# Patient Record
Sex: Male | Born: 1950 | Race: White | Hispanic: No
Health system: Southern US, Community
[De-identification: ages and names within clinical notes are randomized; demographics above are authoritative.]

## PROBLEM LIST (undated history)

## (undated) DIAGNOSIS — C801 Malignant (primary) neoplasm, unspecified: Secondary | ICD-10-CM

---

## 2019-07-25 ENCOUNTER — Emergency Department (HOSPITAL_COMMUNITY): Payer: Medicare HMO

## 2019-07-25 ENCOUNTER — Encounter (HOSPITAL_COMMUNITY): Payer: Self-pay

## 2019-07-25 ENCOUNTER — Inpatient Hospital Stay (HOSPITAL_COMMUNITY)
Admission: EM | Admit: 2019-07-25 | Discharge: 2019-07-29 | DRG: 871 | Disposition: E | Payer: Medicare HMO | Attending: Specialist | Admitting: Specialist

## 2019-07-25 DIAGNOSIS — R6521 Severe sepsis with septic shock: Secondary | ICD-10-CM | POA: Diagnosis present

## 2019-07-25 DIAGNOSIS — Z8507 Personal history of malignant neoplasm of pancreas: Secondary | ICD-10-CM

## 2019-07-25 DIAGNOSIS — K559 Vascular disorder of intestine, unspecified: Secondary | ICD-10-CM | POA: Diagnosis not present

## 2019-07-25 DIAGNOSIS — Z515 Encounter for palliative care: Secondary | ICD-10-CM | POA: Diagnosis present

## 2019-07-25 DIAGNOSIS — Z66 Do not resuscitate: Secondary | ICD-10-CM | POA: Diagnosis present

## 2019-07-25 DIAGNOSIS — A419 Sepsis, unspecified organism: Secondary | ICD-10-CM | POA: Diagnosis present

## 2019-07-25 DIAGNOSIS — N179 Acute kidney failure, unspecified: Secondary | ICD-10-CM | POA: Diagnosis present

## 2019-07-25 DIAGNOSIS — E11649 Type 2 diabetes mellitus with hypoglycemia without coma: Secondary | ICD-10-CM | POA: Diagnosis present

## 2019-07-25 DIAGNOSIS — I4891 Unspecified atrial fibrillation: Secondary | ICD-10-CM | POA: Diagnosis present

## 2019-07-25 DIAGNOSIS — K631 Perforation of intestine (nontraumatic): Secondary | ICD-10-CM | POA: Diagnosis present

## 2019-07-25 DIAGNOSIS — K7200 Acute and subacute hepatic failure without coma: Secondary | ICD-10-CM | POA: Diagnosis present

## 2019-07-25 DIAGNOSIS — R34 Anuria and oliguria: Secondary | ICD-10-CM | POA: Diagnosis present

## 2019-07-25 DIAGNOSIS — I469 Cardiac arrest, cause unspecified: Secondary | ICD-10-CM | POA: Diagnosis present

## 2019-07-25 DIAGNOSIS — Z20828 Contact with and (suspected) exposure to other viral communicable diseases: Secondary | ICD-10-CM | POA: Diagnosis present

## 2019-07-25 DIAGNOSIS — K6389 Other specified diseases of intestine: Secondary | ICD-10-CM

## 2019-07-25 DIAGNOSIS — J9602 Acute respiratory failure with hypercapnia: Secondary | ICD-10-CM | POA: Diagnosis present

## 2019-07-25 HISTORY — DX: Malignant (primary) neoplasm, unspecified: C80.1

## 2019-07-25 LAB — RESPIRATORY PANEL BY RT PCR (FLU A&B, COVID)
Influenza A by PCR: NEGATIVE
Influenza B by PCR: NEGATIVE
SARS Coronavirus 2 by RT PCR: NEGATIVE

## 2019-07-25 LAB — RAPID URINE DRUG SCREEN, HOSP PERFORMED
Amphetamines: NOT DETECTED
Barbiturates: NOT DETECTED
Benzodiazepines: NOT DETECTED
Cocaine: NOT DETECTED
Opiates: NOT DETECTED
Tetrahydrocannabinol: NOT DETECTED

## 2019-07-25 LAB — URINALYSIS, ROUTINE W REFLEX MICROSCOPIC
Bilirubin Urine: NEGATIVE
Glucose, UA: 50 mg/dL — AB
Ketones, ur: NEGATIVE mg/dL
Leukocytes,Ua: NEGATIVE
Nitrite: NEGATIVE
Protein, ur: 30 mg/dL — AB
Specific Gravity, Urine: 1.016 (ref 1.005–1.030)
pH: 5 (ref 5.0–8.0)

## 2019-07-25 LAB — POCT I-STAT EG7
Acid-base deficit: 25 mmol/L — ABNORMAL HIGH (ref 0.0–2.0)
Bicarbonate: 9 mmol/L — ABNORMAL LOW (ref 20.0–28.0)
Calcium, Ion: 1 mmol/L — ABNORMAL LOW (ref 1.15–1.40)
HCT: 43 % (ref 39.0–52.0)
Hemoglobin: 14.6 g/dL (ref 13.0–17.0)
O2 Saturation: 87 %
Potassium: 3.8 mmol/L (ref 3.5–5.1)
Sodium: 134 mmol/L — ABNORMAL LOW (ref 135–145)
TCO2: 10 mmol/L — ABNORMAL LOW (ref 22–32)
pCO2, Ven: 49.8 mmHg (ref 44.0–60.0)
pH, Ven: 6.865 — CL (ref 7.250–7.430)
pO2, Ven: 90 mmHg — ABNORMAL HIGH (ref 32.0–45.0)

## 2019-07-25 LAB — POCT I-STAT 7, (LYTES, BLD GAS, ICA,H+H)
Acid-base deficit: 20 mmol/L — ABNORMAL HIGH (ref 0.0–2.0)
Bicarbonate: 12.2 mmol/L — ABNORMAL LOW (ref 20.0–28.0)
Calcium, Ion: 1.01 mmol/L — ABNORMAL LOW (ref 1.15–1.40)
HCT: 28 % — ABNORMAL LOW (ref 39.0–52.0)
Hemoglobin: 9.5 g/dL — ABNORMAL LOW (ref 13.0–17.0)
O2 Saturation: 96 %
Patient temperature: 93.8
Potassium: 4.2 mmol/L (ref 3.5–5.1)
Sodium: 135 mmol/L (ref 135–145)
TCO2: 14 mmol/L — ABNORMAL LOW (ref 22–32)
pCO2 arterial: 51.3 mmHg — ABNORMAL HIGH (ref 32.0–48.0)
pH, Arterial: 6.965 — CL (ref 7.350–7.450)
pO2, Arterial: 115 mmHg — ABNORMAL HIGH (ref 83.0–108.0)

## 2019-07-25 LAB — CBC WITH DIFFERENTIAL/PLATELET
Abs Immature Granulocytes: 0.34 10*3/uL — ABNORMAL HIGH (ref 0.00–0.07)
Basophils Absolute: 0.1 10*3/uL (ref 0.0–0.1)
Basophils Relative: 1 %
Eosinophils Absolute: 0.1 10*3/uL (ref 0.0–0.5)
Eosinophils Relative: 0 %
HCT: 44.8 % (ref 39.0–52.0)
Hemoglobin: 13.7 g/dL (ref 13.0–17.0)
Immature Granulocytes: 2 %
Lymphocytes Relative: 14 %
Lymphs Abs: 2.2 10*3/uL (ref 0.7–4.0)
MCH: 30.2 pg (ref 26.0–34.0)
MCHC: 30.6 g/dL (ref 30.0–36.0)
MCV: 98.9 fL (ref 80.0–100.0)
Monocytes Absolute: 1.8 10*3/uL — ABNORMAL HIGH (ref 0.1–1.0)
Monocytes Relative: 11 %
Neutro Abs: 11.8 10*3/uL — ABNORMAL HIGH (ref 1.7–7.7)
Neutrophils Relative %: 72 %
Platelets: 207 10*3/uL (ref 150–400)
RBC: 4.53 MIL/uL (ref 4.22–5.81)
RDW: 14.3 % (ref 11.5–15.5)
WBC: 16.3 10*3/uL — ABNORMAL HIGH (ref 4.0–10.5)
nRBC: 0.2 % (ref 0.0–0.2)

## 2019-07-25 LAB — TYPE AND SCREEN
ABO/RH(D): B POS
Antibody Screen: NEGATIVE

## 2019-07-25 LAB — PROTIME-INR
INR: 1.5 — ABNORMAL HIGH (ref 0.8–1.2)
Prothrombin Time: 17.5 seconds — ABNORMAL HIGH (ref 11.4–15.2)

## 2019-07-25 LAB — POC SARS CORONAVIRUS 2 AG -  ED: SARS Coronavirus 2 Ag: NEGATIVE

## 2019-07-25 LAB — I-STAT CHEM 8, ED
BUN: 50 mg/dL — ABNORMAL HIGH (ref 8–23)
Calcium, Ion: 1 mmol/L — ABNORMAL LOW (ref 1.15–1.40)
Chloride: 102 mmol/L (ref 98–111)
Creatinine, Ser: 2.9 mg/dL — ABNORMAL HIGH (ref 0.61–1.24)
Glucose, Bld: 180 mg/dL — ABNORMAL HIGH (ref 70–99)
HCT: 43 % (ref 39.0–52.0)
Hemoglobin: 14.6 g/dL (ref 13.0–17.0)
Potassium: 4 mmol/L (ref 3.5–5.1)
Sodium: 133 mmol/L — ABNORMAL LOW (ref 135–145)
TCO2: 11 mmol/L — ABNORMAL LOW (ref 22–32)

## 2019-07-25 LAB — POC OCCULT BLOOD, ED: Fecal Occult Bld: POSITIVE — AB

## 2019-07-25 LAB — LACTIC ACID, PLASMA: Lactic Acid, Venous: >11 mmol/L (ref 0.5–1.9)

## 2019-07-25 LAB — LIPASE, BLOOD: Lipase: 13 U/L (ref 11–51)

## 2019-07-25 LAB — ABO/RH: ABO/RH(D): B POS

## 2019-07-25 LAB — TROPONIN I (HIGH SENSITIVITY): Troponin I (High Sensitivity): 80 ng/L — ABNORMAL HIGH (ref <18)

## 2019-07-25 MED ORDER — VANCOMYCIN HCL IN DEXTROSE 1-5 GM/200ML-% IV SOLN
1000.0000 mg | Freq: Once | INTRAVENOUS | Status: AC
  Administered 2019-07-25: 1000 mg via INTRAVENOUS
  Filled 2019-07-25: qty 200

## 2019-07-25 MED ORDER — SODIUM BICARBONATE 8.4 % IV SOLN
INTRAVENOUS | Status: AC | PRN
  Administered 2019-07-25: 50 meq via INTRAVENOUS

## 2019-07-25 MED ORDER — PANTOPRAZOLE SODIUM 40 MG IV SOLR
40.0000 mg | Freq: Two times a day (BID) | INTRAVENOUS | Status: DC
  Administered 2019-07-26: 40 mg via INTRAVENOUS
  Filled 2019-07-25: qty 40

## 2019-07-25 MED ORDER — SODIUM CHLORIDE 0.9 % IV SOLN
INTRAVENOUS | Status: AC | PRN
  Administered 2019-07-25 (×4): 1000 mL via INTRAVENOUS

## 2019-07-25 MED ORDER — STERILE WATER FOR INJECTION IV SOLN
INTRAVENOUS | Status: DC
  Filled 2019-07-25 (×2): qty 850

## 2019-07-25 MED ORDER — SODIUM CHLORIDE 0.9 % IV SOLN: Freq: Once | INTRAVENOUS | Status: AC

## 2019-07-25 MED ORDER — SODIUM BICARBONATE 8.4 % IV SOLN
INTRAVENOUS | Status: AC
  Filled 2019-07-25: qty 50

## 2019-07-25 MED ORDER — SODIUM CHLORIDE 0.9 % IV SOLN
2.0000 g | Freq: Once | INTRAVENOUS | Status: AC
  Administered 2019-07-25: 2 g via INTRAVENOUS
  Filled 2019-07-25: qty 2

## 2019-07-25 MED ORDER — NOREPINEPHRINE BITARTRATE 1 MG/ML IV SOLN
INTRAVENOUS | Status: AC | PRN
  Administered 2019-07-25: 5 mg via INTRAVENOUS
  Administered 2019-07-25: 20 mg via INTRAVENOUS
  Administered 2019-07-25: 2 mg via INTRAVENOUS
  Administered 2019-07-25: 10 mg via INTRAVENOUS
  Administered 2019-07-25: 15 mg via INTRAVENOUS

## 2019-07-25 MED ORDER — HEPARIN SODIUM (PORCINE) 5000 UNIT/ML IJ SOLN
5000.0000 [IU] | Freq: Three times a day (TID) | INTRAMUSCULAR | Status: DC
  Administered 2019-07-26: 5000 [IU] via SUBCUTANEOUS
  Filled 2019-07-25: qty 1

## 2019-07-25 MED ORDER — NOREPINEPHRINE BITARTRATE 1 MG/ML IV SOLN
INTRAVENOUS | Status: AC | PRN
  Administered 2019-07-25: 15 mg via INTRAVENOUS

## 2019-07-25 MED ORDER — NOREPINEPHRINE 4 MG/250ML-% IV SOLN
INTRAVENOUS | Status: AC
  Filled 2019-07-25: qty 250

## 2019-07-25 MED ORDER — METRONIDAZOLE IN NACL 5-0.79 MG/ML-% IV SOLN
500.0000 mg | Freq: Once | INTRAVENOUS | Status: AC
  Administered 2019-07-25: 500 mg via INTRAVENOUS
  Filled 2019-07-25: qty 100

## 2019-07-25 NOTE — ED Triage Notes (Signed)
Pt comes via [facility], hx of pancreatic cancer, has been c/o of n/v all day, LSN at 1830, found in the bathroom unresponsive by wife, in PEA upon firs arrival, CPR started, received 7 minutes total and 2 epi, now in afib.

## 2019-07-25 NOTE — ED Provider Notes (Addendum)
[facility] EMERGENCY DEPARTMENT Provider Note   CSN: [id number] Arrival date & time: 07/01/2019  2007     History Chief Complaint  Patient presents with  . Cardiac Arrest    Cody Mcdowell is a 68 y.o. male.  Level 5 caveat due to critical condition.  Patient with [facility] airway.  Per family patient with not feeling well over the last several days.  Has had some nausea vomiting and abdominal pain.  Lost pulses with EMS upon arrival.  Had PEA.  CPR for 7 minutes.  Return of spontaneous circulation after 2 rounds of epinephrine.  Atrial fibrillation and hypotension upon arrival.  The history is provided by the patient.  Cardiac Arrest Witnessed by:  Family member Incident location:  Home Time since incident:  2 hours Time before ALS initiated:  Immediate Condition upon EMS arrival:  Agonal respirations Pulse:  Weak (eventually absent) Initial cardiac rhythm per EMS:  PEA Treatments prior to arrival:  ACLS protocol Medications given prior to ED:  Epinephrine Airway: king. Rhythm on admission to ED:  Atrial fibrillation      Past Medical History:  Diagnosis Date  . Cancer [facility])     Patient Active Problem List   Diagnosis Date Noted  . Bowel perforation ([place])   . Ischemic bowel disease ([place])   . Cardiac arrest ([place]) 07/24/2019      No family history on file.  Social History   Tobacco Use  . Smoking status: Not on file  Substance Use Topics  . Alcohol use: Not on file  . Drug use: Not on file    Home Medications Prior to Admission medications   Not on File    Allergies    Patient has no allergy information on record.  Review of Systems   Review of Systems  Unable to perform ROS: Acuity of condition    Physical Exam Updated Vital Signs  ED Triage Vitals  Enc Vitals Group     BP 07/01/2019 2015 (!) 74/47     Pulse Rate 07/04/2019 2011 82     Resp 06/28/2019 2015 (!) 26     Temp 06/30/2019 2034 98.2 F (36.8 C)     Temp Source  07/10/2019 2034 Rectal     SpO2 07/27/2019 2015 100 %     Weight --      Height 07/08/2019 2027 5\' 11"  (1.803 m)     Head Circumference --      Peak Flow --      Pain Score --      Pain Loc --      Pain Edu? --      Excl. in [place]? --     Physical Exam Constitutional:      General: He is in acute distress.     Appearance: He is ill-appearing.  HENT:     Head: Normocephalic and atraumatic.     Nose: Nose normal.     Mouth/Throat:     Mouth: Mucous membranes are dry.  Eyes:     Conjunctiva/sclera: Conjunctivae normal.     Comments: 2 mm and sluggish b/l  Cardiovascular:     Rate and Rhythm: Tachycardia present. Rhythm irregular.     Pulses: Normal pulses.     Heart sounds: Normal heart sounds.  Pulmonary:     Breath sounds: Rhonchi present.     Comments: King airway in place with vomitus Abdominal:     General: There is distension.     Comments:  Firm abdomen  Musculoskeletal:        General: No deformity.     Cervical back: Neck supple.     Right lower leg: No edema.     Left lower leg: No edema.  Skin:    General: Skin is dry.     Capillary Refill: Capillary refill takes more than 3 seconds.     Coloration: Skin is pale.     Comments: Extremities cool and mottled   Neurological:     Mental Status: He is unresponsive.     GCS: GCS eye subscore is 1. GCS verbal subscore is 1. GCS motor subscore is 1.     ED Results / Procedures / Treatments   Labs (all labs ordered are listed, but only abnormal results are displayed) Labs Reviewed  LACTIC ACID, PLASMA - Abnormal; Notable for the following components:      Result Value   Lactic Acid, Venous >11.0 (*)    All other components within normal limits  COMPREHENSIVE METABOLIC PANEL - Abnormal; Notable for the following components:   Chloride 95 (*)    CO2 10 (*)    Glucose, Bld 192 (*)    BUN 45 (*)    Creatinine, Ser 3.13 (*)    Total Protein 6.3 (*)    Albumin 2.9 (*)    AST 181 (*)    GFR calc non Af Amer 19 (*)     GFR calc Af Amer 22 (*)    Anion gap 32 (*)    All other components within normal limits  CBC WITH DIFFERENTIAL/PLATELET - Abnormal; Notable for the following components:   WBC 16.3 (*)    Neutro Abs 11.8 (*)    Monocytes Absolute 1.8 (*)    Abs Immature Granulocytes 0.34 (*)    All other components within normal limits  URINALYSIS, ROUTINE W REFLEX MICROSCOPIC - Abnormal; Notable for the following components:   APPearance HAZY (*)    Glucose, UA 50 (*)    Hgb urine dipstick MODERATE (*)    Protein, ur 30 (*)    Bacteria, UA RARE (*)    All other components within normal limits  PROTIME-INR - Abnormal; Notable for the following components:   Prothrombin Time 17.5 (*)    INR 1.5 (*)    All other components within normal limits  LACTIC ACID, PLASMA - Abnormal; Notable for the following components:   Lactic Acid, Venous >11.0 (*)    All other components within normal limits  I-STAT CHEM 8, ED - Abnormal; Notable for the following components:   Sodium 133 (*)    BUN 50 (*)    Creatinine, Ser 2.90 (*)    Glucose, Bld 180 (*)    Calcium, Ion 1.00 (*)    TCO2 11 (*)    All other components within normal limits  POC OCCULT BLOOD, ED - Abnormal; Notable for the following components:   Fecal Occult Bld POSITIVE (*)    All other components within normal limits  POCT I-STAT EG7 - Abnormal; Notable for the following components:   pH, Ven 6.865 (*)    pO2, Ven 90.0 (*)    Bicarbonate 9.0 (*)    TCO2 10 (*)    Acid-base deficit 25.0 (*)    Sodium 134 (*)    Calcium, Ion 1.00 (*)    All other components within normal limits  POCT I-STAT 7, (LYTES, BLD GAS, ICA,H+H) - Abnormal; Notable for the following components:   pH, Arterial 6.965 (*)  pCO2 arterial 51.3 (*)    pO2, Arterial 115.0 (*)    Bicarbonate 12.2 (*)    TCO2 14 (*)    Acid-base deficit 20.0 (*)    Calcium, Ion 1.01 (*)    HCT 28.0 (*)    Hemoglobin 9.5 (*)    All other components within normal limits  POCT I-STAT  7, (LYTES, BLD GAS, ICA,H+H) - Abnormal; Notable for the following components:   pH, Arterial 7.134 (*)    pCO2 arterial 27.1 (*)    Bicarbonate 9.6 (*)    TCO2 11 (*)    Acid-base deficit 19.0 (*)    Calcium, Ion 0.92 (*)    HCT 32.0 (*)    Hemoglobin 10.9 (*)    All other components within normal limits  TROPONIN I (HIGH SENSITIVITY) - Abnormal; Notable for the following components:   Troponin I (High Sensitivity) 80 (*)    All other components within normal limits  RESPIRATORY PANEL BY RT PCR (FLU A&B, COVID)  CULTURE, BLOOD (ROUTINE X 2)  CULTURE, BLOOD (ROUTINE X 2)  URINE CULTURE  LIPASE, BLOOD  RAPID URINE DRUG SCREEN, HOSP PERFORMED  ETHANOL  LACTIC ACID, PLASMA  BLOOD GAS, ARTERIAL  LACTIC ACID, PLASMA  CBC  CREATININE, SERUM  CBC  BASIC METABOLIC PANEL  MAGNESIUM  PHOSPHORUS  BLOOD GAS, ARTERIAL  HEMOGLOBIN A1C  POC SARS CORONAVIRUS 2 AG -  ED  TYPE AND SCREEN  ABO/RH  TROPONIN I (HIGH SENSITIVITY)    EKG  Patient EKG shows atrial fibrillation.  Has some ST depressions laterally but no ischemic changes or ST elevation  Radiology CT ABDOMEN PELVIS WO CONTRAST  Result Date: 07/19/2019 CLINICAL DATA:  Shortness of breath. Found unresponsive in the bathroom. Post CPR. History of pancreatic cancer. Abdominal distension; Shortness of breath EXAM: CT CHEST, ABDOMEN AND PELVIS WITHOUT CONTRAST TECHNIQUE: Multidetector CT imaging of the chest, abdomen and pelvis was performed following the standard protocol without IV contrast. COMPARISON:  Chest radiograph earlier this day. No remote exams available. FINDINGS: CT CHEST FINDINGS Cardiovascular: Aortic atherosclerosis. No aortic aneurysm. Heart is normal in size. No pericardial effusion. Mediastinum/Nodes: 1 endotracheal tube tip above the carina. Enteric tube in place, despite anterior to the esophagus is dilated and fluid-filled. Limited assessment for adenopathy given lack of IV contrast, no bulky mediastinal or hilar  adenopathy. Slightly heterogeneous thyroid gland without dominant nodule. Lungs/Pleura: Consolidation in the dependent right lower lobe. Minimal dependent atelectasis in the left lower lobe. Minimal debris in the mainstem bronchi. Mild central bronchial thickening. Small ground-glass opacity in the anterior right middle lobe may be pulmonary contusion. No pleural fluid. No definite pulmonary nodule or mass, breathing motion artifact limits assessment. Musculoskeletal: Advanced degenerative change of the shoulders. Motion artifact through the sternum limits assessment for fracture. Motion artifact through the anterior ribs limits assessment for fracture. No evidence of focal bone lesion. CT ABDOMEN PELVIS FINDINGS Hepatobiliary: Branching air in the left greater than right lobe of the liver, favor mesenteric gas or pneumobilia. Post cholecystectomy. Limited assessment for focal liver lesion given motion and lack contrast. Pancreas: No definite pancreatic tissue visualized, multiple surgical clips suggesting prior Whipple procedure. Spleen: No definite splenic tissue visualized, suspected splenectomy. Adrenals/Urinary Tract: No dominant adrenal nodule. No hydronephrosis. There is bilateral perinephric edema. Foley catheter decompresses the urinary bladder. Stomach/Bowel: Enteric tube tip in the stomach. Gastric anatomy is not well-defined. There is pneumatosis involving the cecum and ascending colon with mottled air in the adjacent mesentery and free air  in the anterior right abdomen. Small and large bowel diffusely fluid-filled and dilated. Mesenteric venous air is most prominent in the ileocolic mesentery, but also seen centrally. Vascular/Lymphatic: Aortic atherosclerosis. Air in the mesenteric veins of the ileocolic mesentery. Limited assessment for adenopathy. Reproductive: Prostate gland grossly normal, partially obscured by external artifact. Other: Mesenteric edema, air, and small amount of free fluid. Minimal  ascites/free fluid in the upper abdomen. Musculoskeletal: No focal bone lesion. IMPRESSION: 1. Cecal and ascending colonic pneumatosis with mottled air in the adjacent mesentery, mesenteric venous gas and multiple foci free air in the right abdomen. Findings consistent with bowel ischemia and probable bowel perforation given the free air. 2. Branching air in the left greater than right lobe of the liver, favor mesenteric gas over pneumobilia. 3. Small and large bowel diffusely fluid-filled and dilated, likely secondary to ileus. 4. Consolidation in the dependent right lower lobe, may be atelectasis, aspiration or pneumonia. Minimal debris in the mainstem bronchi. 5. Probable prior Whipple procedure, recommend correlation with surgical history. 6. Bilateral perinephric edema, nonspecific. Aortic Atherosclerosis (ICD10-I70.0). Critical Value/emergent results were called by telephone at the time of interpretation on 06/29/2019 at 11:06 pm to [place] , who verbally acknowledged these results. Electronically Signed   By: Keith Rake M.D.   On: 07/03/2019 23:08   CT Head Wo Contrast  Result Date: 07/04/2019 CLINICAL DATA:  Initial evaluation for acute headache.  Trauma. EXAM: CT HEAD WITHOUT CONTRAST TECHNIQUE: Contiguous axial images were obtained from the base of the skull through the vertex without intravenous contrast. COMPARISON:  None. FINDINGS: Brain: Mild age-related cerebral atrophy with chronic small vessel ischemic disease. Few scattered subcentimeter hypodensities involving the deep white matter of the right cerebral hemisphere could reflect small remote lacunar infarcts versus dilated perivascular spaces. No evidence for acute intracranial hemorrhage. No findings to suggest acute large vessel territory infarct. No mass lesion, midline shift, or mass effect. Ventricles are normal in size without evidence for hydrocephalus. No extra-axial fluid collection identified. Vascular: No  hyperdense vessel identified. Skull: Scalp soft tissues demonstrate no acute abnormality. Calvarium intact. Sinuses/Orbits: Globes and orbital soft tissues within normal limits. Scattered mucosal thickening noted within the ethmoidal air cells. Paranasal sinuses are otherwise clear. No mastoid effusion. IMPRESSION: 1. No acute intracranial abnormality. 2. Mild age-related cerebral atrophy with chronic small vessel ischemic disease. Electronically Signed   By: Jeannine Boga M.D.   On: 07/02/2019 23:03   CT Chest Wo Contrast  Result Date: 07/09/2019 CLINICAL DATA:  Shortness of breath. Found unresponsive in the bathroom. Post CPR. History of pancreatic cancer. Abdominal distension; Shortness of breath EXAM: CT CHEST, ABDOMEN AND PELVIS WITHOUT CONTRAST TECHNIQUE: Multidetector CT imaging of the chest, abdomen and pelvis was performed following the standard protocol without IV contrast. COMPARISON:  Chest radiograph earlier this day. No remote exams available. FINDINGS: CT CHEST FINDINGS Cardiovascular: Aortic atherosclerosis. No aortic aneurysm. Heart is normal in size. No pericardial effusion. Mediastinum/Nodes: 1 endotracheal tube tip above the carina. Enteric tube in place, despite anterior to the esophagus is dilated and fluid-filled. Limited assessment for adenopathy given lack of IV contrast, no bulky mediastinal or hilar adenopathy. Slightly heterogeneous thyroid gland without dominant nodule. Lungs/Pleura: Consolidation in the dependent right lower lobe. Minimal dependent atelectasis in the left lower lobe. Minimal debris in the mainstem bronchi. Mild central bronchial thickening. Small ground-glass opacity in the anterior right middle lobe may be pulmonary contusion. No pleural fluid. No definite pulmonary nodule or mass, breathing motion artifact limits assessment.  Musculoskeletal: Advanced degenerative change of the shoulders. Motion artifact through the sternum limits assessment for fracture.  Motion artifact through the anterior ribs limits assessment for fracture. No evidence of focal bone lesion. CT ABDOMEN PELVIS FINDINGS Hepatobiliary: Branching air in the left greater than right lobe of the liver, favor mesenteric gas or pneumobilia. Post cholecystectomy. Limited assessment for focal liver lesion given motion and lack contrast. Pancreas: No definite pancreatic tissue visualized, multiple surgical clips suggesting prior Whipple procedure. Spleen: No definite splenic tissue visualized, suspected splenectomy. Adrenals/Urinary Tract: No dominant adrenal nodule. No hydronephrosis. There is bilateral perinephric edema. Foley catheter decompresses the urinary bladder. Stomach/Bowel: Enteric tube tip in the stomach. Gastric anatomy is not well-defined. There is pneumatosis involving the cecum and ascending colon with mottled air in the adjacent mesentery and free air in the anterior right abdomen. Small and large bowel diffusely fluid-filled and dilated. Mesenteric venous air is most prominent in the ileocolic mesentery, but also seen centrally. Vascular/Lymphatic: Aortic atherosclerosis. Air in the mesenteric veins of the ileocolic mesentery. Limited assessment for adenopathy. Reproductive: Prostate gland grossly normal, partially obscured by external artifact. Other: Mesenteric edema, air, and small amount of free fluid. Minimal ascites/free fluid in the upper abdomen. Musculoskeletal: No focal bone lesion. IMPRESSION: 1. Cecal and ascending colonic pneumatosis with mottled air in the adjacent mesentery, mesenteric venous gas and multiple foci free air in the right abdomen. Findings consistent with bowel ischemia and probable bowel perforation given the free air. 2. Branching air in the left greater than right lobe of the liver, favor mesenteric gas over pneumobilia. 3. Small and large bowel diffusely fluid-filled and dilated, likely secondary to ileus. 4. Consolidation in the dependent right lower lobe,  may be atelectasis, aspiration or pneumonia. Minimal debris in the mainstem bronchi. 5. Probable prior Whipple procedure, recommend correlation with surgical history. 6. Bilateral perinephric edema, nonspecific. Aortic Atherosclerosis (ICD10-I70.0). Critical Value/emergent results were called by telephone at the time of interpretation on 06/30/2019 at 11:06 pm to [place] , who verbally acknowledged these results. Electronically Signed   By: Keith Rake M.D.   On: 07/22/2019 23:08   DG Chest Portable 1 View  Result Date: 07/17/2019 CLINICAL DATA:  Hypoxia EXAM: PORTABLE CHEST 1 VIEW COMPARISON:  None. FINDINGS: Endotracheal tube tip is 7.1 cm above the carina. Nasogastric tube tip and side port are in the stomach. No pneumothorax. Lungs are clear. Heart size and pulmonary vascularity are normal. No adenopathy. There is extensive arthropathy in each shoulder. There are multiple surgical clips in the upper abdomen. IMPRESSION: Tube positions as described without pneumothorax. No edema or consolidation. Cardiac silhouette normal. Electronically Signed   By: Lowella Grip III M.D.   On: 07/10/2019 20:33    Procedures .Critical Care Performed by: Lennice Sites, DO Authorized by: Lennice Sites, DO   Critical care provider statement:    Critical care time (minutes):  85   Critical care was necessary to treat or prevent imminent or life-threatening deterioration of the following conditions:  Sepsis and shock   Critical care was time spent personally by me on the following activities:  Blood draw for specimens, development of treatment plan with patient or surrogate, discussions with primary provider, evaluation of patient's response to treatment, examination of patient, ordering and performing treatments and interventions, ordering and review of laboratory studies, ordering and review of radiographic studies, pulse oximetry, review of old charts, re-evaluation of patient's condition  and obtaining history from patient or surrogate  Procedure Name: Intubation Date/Time: 08-22-2019  1:16 AM Performed by: Lennice Sites, DO Pre-anesthesia Checklist: Patient identified Oxygen Delivery Method: Ambu bag Preoxygenation: Pre-oxygenation with 100% oxygen Ventilation: Mask ventilation without difficulty Laryngoscope Size: Glidescope and 3 Grade View: Grade I Tube size: 7.5 mm Number of attempts: 1 Airway Equipment and Method: Video-laryngoscopy and Rigid stylet Placement Confirmation: ETT inserted through vocal cords under direct vision,  Positive ETCO2 and Breath sounds checked- equal and bilateral Secured at: 24 cm Tube secured with: ETT holder Dental Injury: Teeth and Oropharynx as per pre-operative assessment  Difficulty Due To: Difficulty was unanticipated Comments: After xray, advanced tube 2 more cm      (including critical care time)  Medications Ordered in ED Medications  sodium bicarbonate 150 mEq in sterile water 1,000 mL infusion ( Intravenous New Bag/Given 07/18/2019 2244)  norepinephrine (LEVOPHED) 4-5 MG/250ML-% infusion SOLN (has no administration in time range)  heparin injection 5,000 Units (has no administration in time range)  pantoprazole (PROTONIX) injection 40 mg (has no administration in time range)  insulin aspart (novoLOG) injection 0-9 Units (has no administration in time range)  piperacillin-tazobactam (ZOSYN) IVPB 3.375 g (has no administration in time range)  piperacillin-tazobactam (ZOSYN) IVPB 3.375 g (has no administration in time range)  norepinephrine (LEVOPHED) injection (20 mg Intravenous Given 07/11/2019 2201)  0.9 %  sodium chloride infusion (1,000 mLs Intravenous New Bag/Given 07/02/2019 2200)  ceFEPIme (MAXIPIME) 2 g in sodium chloride 0.9 % 100 mL IVPB (0 g Intravenous Stopped 07/07/2019 2228)  vancomycin (VANCOCIN) IVPB 1000 mg/200 mL premix (0 mg Intravenous Stopped 06/30/2019 2228)  sodium bicarbonate injection ( Intravenous Canceled  Entry 07/11/2019 2100)  metroNIDAZOLE (FLAGYL) IVPB 500 mg (0 mg Intravenous Stopped 07/12/2019 2338)  0.9 %  sodium chloride infusion ( Intravenous New Bag/Given 07/22/2019 2235)  norepinephrine (LEVOPHED) injection (15 mg Intravenous Given 07/13/2019 2339)    ED Course  I have reviewed the triage vital signs and the nursing notes.  Pertinent labs & imaging results that were available during my care of the patient were reviewed by me and considered in my medical decision making (see chart for details).    MDM Rules/Calculators/A&P     Cody Mcdowell is a 68 year old male with history of pancreatic cancer but unknown if other medical problems.  Patient comes in after CPR.  Patient had faint pulses and agonal breathing with fire department and had started CPR by the time EMS arrived.  After 2 rounds of CPR total of about 7 minutes patient had return of spontaneous circulation.  He had PEA on rhythm strip throughout CPR.  He was given 2 rounds of epinephrine.  King airway was placed.  EMS had about a 40-minute transfer time from the scene.  He was started on epinephrine for hypotension.  He arrives with good pulses.  He has brownish/blood-tinged stool on the stretcher and there is some vomitus from the [facility] airway.  Patient arrived hypotensive and was started on Levophed and 4 L of normal saline was ordered.  Patient had successful intubation without any paralytics or sedation.  Patient was obtunded.  Had good pulses centrally but faint distal pulses.  Abdomen appeared slightly rigid.  EKG showed atrial fibrillation between 90 and 120.  No ischemic changes.  I was able to obtain some history from the patient's aunt states that the patient had not been feeling well since Saturday.  Has had 3 days of nausea and vomiting and generalized malaise.  Was refusing to go to the hospital despite not feeling well and having  poor appetite.  She was unable to provide much history about his chronic medical problems.  Did  state that he did not have any cardiac issues.  Was not complaining of any chest pain.  I tried to perform bedside echocardiogram but could not obtain any good windows.  Broad work-up was initiated.  Patient found to have a pH of 6.8 however his CO2 was overall unremarkable.  Blood sugar was unremarkable as well.  Suspect a metabolic acidosis.  Was given amps of bicarb.  Empirically started on IV antibiotics including vancomycin, cefepime, Flagyl.  Possible intra-abdominal process.  Will get CT scan of his head, chest, abdomen, pelvis for further evaluation.  Patient does have a lactic acidosis greater than 11.  White count of 16.  Hemoglobin however is normal at 13.  Patient on 15 mcg of Levophed.  He is intubated.  Chest x-ray thus far shows no infectious process.  Awaiting CMP and CT imaging.  Intensive care team has been consulted.  Patient is hypothermic and started warming blankets. Patient critically ill and suspect intra-abdominal process.  Radiology called me on the phone to talk about CT scan of the abdomen and pelvis that shows likely ischemic bowel process.  There is likely perforation.  There is cecal and ascending colonic pneumatosis with mottled air within the adjacent mesentery.  Overall findings are consistent with bowel ischemia likely bowel perforation.  CT scan of the chest and head are unremarkable.  Patient has already been started on antibiotics.  Dr. Reece Agar with general surgery has been made aware of CT scan findings and will come evaluate the patient and discuss any surgical options with family.  Talk to critical care team and updated them about imaging.  They are down to the ED evaluated the patient as well.  Had discussion with them about possibly using heparin but will defer at this time to their management.  He remains critically ill but hemodynamically stable on 20 mcg of Levophed.  Blood pressure has stabilized.  Heart rate in the 90s.  He continues to be hypothermic.  Repeat  lactic acid is ordered as well as continuing of IV bicarb infusion.  Appears to be having good response IV fluids as has made a couple 100 cc of urine on reevaluation.  Extremities appear to be still cool.  Will continue further IV fluid hydration.  This chart was dictated using voice recognition software.  Despite best efforts to proofread,  errors can occur which can change the documentation meaning.     Final Clinical Impression(s) / ED Diagnoses Final diagnoses:  Sepsis, due to unspecified organism, unspecified whether acute organ dysfunction present [facility])  Bowel perforation ([place])  Ischemic bowel disease ([place])  Pneumatosis of intestines  Septic shock [facility])    Rx / DC Orders ED Discharge Orders    None       Lennice Sites, DO 07/27/2019 2347    Lennice Sites, DO 08/11/19 0119

## 2019-07-25 NOTE — H&P (Addendum)
NAME:  Cody Mcdowell, MRN:  [id number], DOB:  07-Feb-1951, LOS: 0 ADMISSION DATE:  07/15/2019, CONSULTATION DATE: 12/28 REFERRING MD: Dr. Ronnald Nian EDP, CHIEF COMPLAINT: Cardiac arrest  Brief History   68 year old male found down in bathroom unresponsive.  PEA on EMS arrival.  ROSC after 7 minutes of ACLS.  Unresponsive in ED  History of present illness   68 year old male past medical history as below, which is significant for pancreatic cancer in the 90s.  Other medical history unknown at this time.  According to an aunt he has been complaining of abdominal pain nausea/vomiting for the past 3 days.  He was last seen well around 1830 on 12/20 and was found approximately 30 minutes later by his wife down on the floor the bathroom.  EMS was called and was there very shortly initiating CPR.  ACLS was undertaken for approximately 7 minutes prior to ROSC.  Upon arrival to the emergency department he was intubated.  Laboratory evaluation significant for creatinine 3.13, bicarb 10, AST 181, lactic acid greater than 11, pH 6.97, WBC 16.3.  CT scan of the abdomen concerning for bowel ischemia and probable bowel perforation.  PCCM asked to admit.   Past Medical History   has a past medical history of Cancer ([place]).   Significant Hospital Events   12/28 admit  Consults:    Procedures:  ETT 12/28 >  Significant Diagnostic Tests:  CT head 12/28 > No acute intracranial abnormality CT chest, abd, pelv 12/28 > findings consistent with bowel ischemia and probable bowel perforation. Air in the left > right lobe of the liver. Prior whipple.   Micro Data:  Blood 12/28 >  Antimicrobials:  Zosyn 12/28 >  Interim history/subjective:    Objective   Blood pressure 107/83, pulse (!) 107, temperature (!) 92.2 F (33.4 C), resp. rate (!) 21, height 5\' 11"  (1.803 m), SpO2 100 %.    Vent Mode: PRVC FiO2 (%):  [100 %] 100 % Set Rate:  [20 bmp-24 bmp] 24 bmp Vt Set:  [600 mL] 600 mL PEEP:  [5 cmH20]  5 cmH20 Plateau Pressure:  [17 cmH20] 17 cmH20   Intake/Output Summary (Last 24 hours) at 07/20/2019 2326 Last data filed at 07/21/2019 2228 Gross per 24 hour  Intake 300 ml  Output --  Net 300 ml   There were no vitals filed for this visit.  Examination: General: chronically ill appearing male in NAD HENT: [place]/AT, PERRL, no JVD Lungs: Clear bilateral Cardiovascular: IRIR tachy, no MRG Abdomen: Firm distended. Absent bowel sounds.  Extremities: No acute deformity or ROM limitation Neuro: Unresponsive. No pain response. Infrequent myoclonus. + corneal. + gag.  GU: Foley  Resolved Hospital Problem list     Assessment & Plan:   Cardiac arrest - Admit to ICU  - Telemetry - Will defer TTM due to possible surgical need, profound acidosis, shock - EEG - Trend BMP, Lactic, troponin  Bowel ischemia Bowel perforation - Zosyn - Surgery consulted. I suspect he will not be a surgical candidate, which would render his current situation likely non-survivable.  - NGT to LIS - NPO  Septic vs Cardiogenic shock - Volume resuscitation - Levophed for MAP > 65 mmHg - may need CVL, current dose of levophed should be able to be weaned.   Acute hypercarbic respiratory failure - Full vent support - Follow ABG - VAP bundle  DM - SSI  Atrial fibrillation with RVR: likely due to the severity of his illness.  - Defer treatment  at this time - Monitor - If persists pending improvement would consider amiodarone infusion.   Best practice:  Diet: NPO Pain/Anxiety/Delirium protocol (if indicated): NA VAP protocol (if indicated): Per protocol DVT prophylaxis: SQH GI prophylaxis: PPI BID Glucose control: NA Mobility: BR Code Status: DNR Family Communication: Aunt [name] (helped raise him) [phone] (cell) [phone] (home). PCP is Dr Arelia Sneddon in [place]. Aunt feels that if surgery is an option we should be aggressive. Otherwise continue conservative management and  possibly transition to comfort care.  Disposition: ICU  Labs   CBC: Recent Labs  Lab 07/27/2019 2021 07/17/2019 2022 07/01/2019 2046 07/16/2019 2115  WBC  --   --  16.3*  --   NEUTROABS  --   --  11.8*  --   HGB 14.6 14.6 13.7 9.5*  HCT 43.0 43.0 44.8 28.0*  MCV  --   --  98.9  --   PLT  --   --  207  --     Basic Metabolic Panel: Recent Labs  Lab 07/09/2019 2021 07/09/2019 2022 07/17/2019 2046 07/07/2019 2115  NA 134* 133* 137 135  K 3.8 4.0 4.0 4.2  CL  --  102 95*  --   CO2  --   --  10*  --   GLUCOSE  --  180* 192*  --   BUN  --  50* 45*  --   CREATININE  --  2.90* 3.13*  --   CALCIUM  --   --  9.2  --    GFR: CrCl cannot be calculated (Unknown ideal weight.). Recent Labs  Lab 07/19/2019 2045 07/24/2019 2046  WBC  --  16.3*  LATICACIDVEN >11.0*  --     Liver Function Tests: Recent Labs  Lab 07/05/2019 2046  AST 181*  ALT <5  ALKPHOS 94  BILITOT 1.2  PROT 6.3*  ALBUMIN 2.9*   Recent Labs  Lab 07/19/2019 2046  LIPASE 13   No results for input(s): AMMONIA in the last 168 hours.  ABG    Component Value Date/Time   PHART 6.965 (LL) 07/22/2019 2115   PCO2ART 51.3 (H) 07/18/2019 2115   PO2ART 115.0 (H) 07/23/2019 2115   HCO3 12.2 (L) 07/10/2019 2115   TCO2 14 (L) 07/21/2019 2115   ACIDBASEDEF 20.0 (H) 06/29/2019 2115   O2SAT 96.0 07/23/2019 2115     Coagulation Profile: Recent Labs  Lab 07/25/19 2052  INR 1.5*    Cardiac Enzymes: No results for input(s): CKTOTAL, CKMB, CKMBINDEX, TROPONINI in the last 168 hours.  HbA1C: No results found for: HGBA1C  CBG: No results for input(s): GLUCAP in the last 168 hours.  Review of Systems:   Not able.   Past Medical History  He,  has a past medical history of Cancer ([place]).   Surgical History   The histories are not reviewed yet. Please review them in the "History" navigator section and refresh this [place].   Social History      Family History   His family history is not on file.   Allergies Not  on File   Home Medications  Prior to Admission medications   Not on File     Critical care time: 60 mins     Georgann Housekeeper, AGACNP-BC [place] for personal pager PCCM on call pager [phone]  25-Aug-2019 12:21 AM  Patient seen at bedside with NP Hoffman.  Critical care planning and discussed at length and I agree with the above  assessment and plan.  Will await surgical decision.  Prognosis is poor.

## 2019-07-26 ENCOUNTER — Inpatient Hospital Stay (HOSPITAL_COMMUNITY): Payer: Medicare HMO

## 2019-07-26 DIAGNOSIS — K631 Perforation of intestine (nontraumatic): Secondary | ICD-10-CM | POA: Insufficient documentation

## 2019-07-26 DIAGNOSIS — K559 Vascular disorder of intestine, unspecified: Secondary | ICD-10-CM | POA: Insufficient documentation

## 2019-07-26 LAB — URINE CULTURE: Culture: NO GROWTH

## 2019-07-26 LAB — COMPREHENSIVE METABOLIC PANEL
ALT: 152 U/L — ABNORMAL HIGH (ref 0–44)
ALT: 2040 U/L — ABNORMAL HIGH (ref 0–44)
AST: 181 U/L — ABNORMAL HIGH (ref 15–41)
AST: 3497 U/L — ABNORMAL HIGH (ref 15–41)
Albumin: 2.9 g/dL — ABNORMAL LOW (ref 3.5–5.0)
Albumin: 2.6 g/dL — ABNORMAL LOW (ref 3.5–5.0)
Alkaline Phosphatase: 94 U/L (ref 38–126)
Alkaline Phosphatase: 101 U/L (ref 38–126)
Anion gap: 32 — ABNORMAL HIGH (ref 5–15)
Anion gap: 25 — ABNORMAL HIGH (ref 5–15)
BUN: 45 mg/dL — ABNORMAL HIGH (ref 8–23)
BUN: 42 mg/dL — ABNORMAL HIGH (ref 8–23)
CO2: 10 mmol/L — ABNORMAL LOW (ref 22–32)
CO2: 12 mmol/L — ABNORMAL LOW (ref 22–32)
Calcium: 9.2 mg/dL (ref 8.9–10.3)
Calcium: 6.7 mg/dL — ABNORMAL LOW (ref 8.9–10.3)
Chloride: 95 mmol/L — ABNORMAL LOW (ref 98–111)
Chloride: 103 mmol/L (ref 98–111)
Creatinine, Ser: 3.13 mg/dL — ABNORMAL HIGH (ref 0.61–1.24)
Creatinine, Ser: 3.21 mg/dL — ABNORMAL HIGH (ref 0.61–1.24)
GFR calc Af Amer: 22 mL/min — ABNORMAL LOW (ref >60)
GFR calc Af Amer: 22 mL/min — ABNORMAL LOW (ref >60)
GFR calc non Af Amer: 19 mL/min — ABNORMAL LOW (ref >60)
GFR calc non Af Amer: 19 mL/min — ABNORMAL LOW (ref >60)
Glucose, Bld: 192 mg/dL — ABNORMAL HIGH (ref 70–99)
Glucose, Bld: 70 mg/dL (ref 70–99)
Potassium: 4 mmol/L (ref 3.5–5.1)
Potassium: 4.2 mmol/L (ref 3.5–5.1)
Sodium: 137 mmol/L (ref 135–145)
Sodium: 140 mmol/L (ref 135–145)
Total Bilirubin: 1.2 mg/dL (ref 0.3–1.2)
Total Bilirubin: 2 mg/dL — ABNORMAL HIGH (ref 0.3–1.2)
Total Protein: 6.3 g/dL — ABNORMAL LOW (ref 6.5–8.1)
Total Protein: 4.8 g/dL — ABNORMAL LOW (ref 6.5–8.1)

## 2019-07-26 LAB — POCT I-STAT 7, (LYTES, BLD GAS, ICA,H+H)
Acid-base deficit: 19 mmol/L — ABNORMAL HIGH (ref 0.0–2.0)
Acid-base deficit: 21 mmol/L — ABNORMAL HIGH (ref 0.0–2.0)
Bicarbonate: 8.5 mmol/L — ABNORMAL LOW (ref 20.0–28.0)
Bicarbonate: 9.6 mmol/L — ABNORMAL LOW (ref 20.0–28.0)
Calcium, Ion: 0.9 mmol/L — ABNORMAL LOW (ref 1.15–1.40)
Calcium, Ion: 0.92 mmol/L — ABNORMAL LOW (ref 1.15–1.40)
HCT: 30 % — ABNORMAL LOW (ref 39.0–52.0)
HCT: 32 % — ABNORMAL LOW (ref 39.0–52.0)
Hemoglobin: 10.2 g/dL — ABNORMAL LOW (ref 13.0–17.0)
Hemoglobin: 10.9 g/dL — ABNORMAL LOW (ref 13.0–17.0)
O2 Saturation: 96 %
O2 Saturation: 97 %
Patient temperature: 32.5
Patient temperature: 92.1
Potassium: 3.8 mmol/L (ref 3.5–5.1)
Potassium: 4.1 mmol/L (ref 3.5–5.1)
Sodium: 135 mmol/L (ref 135–145)
Sodium: 135 mmol/L (ref 135–145)
TCO2: 11 mmol/L — ABNORMAL LOW (ref 22–32)
TCO2: 9 mmol/L — ABNORMAL LOW (ref 22–32)
pCO2 arterial: 25.8 mmHg — ABNORMAL LOW (ref 32.0–48.0)
pCO2 arterial: 27.1 mmHg — ABNORMAL LOW (ref 32.0–48.0)
pH, Arterial: 7.097 — CL (ref 7.350–7.450)
pH, Arterial: 7.134 — CL (ref 7.350–7.450)
pO2, Arterial: 101 mmHg (ref 83.0–108.0)
pO2, Arterial: 92 mmHg (ref 83.0–108.0)

## 2019-07-26 LAB — CBC
HCT: 36.4 % — ABNORMAL LOW (ref 39.0–52.0)
HCT: 33.5 % — ABNORMAL LOW (ref 39.0–52.0)
Hemoglobin: 11.4 g/dL — ABNORMAL LOW (ref 13.0–17.0)
Hemoglobin: 10.7 g/dL — ABNORMAL LOW (ref 13.0–17.0)
MCH: 30.3 pg (ref 26.0–34.0)
MCH: 30.5 pg (ref 26.0–34.0)
MCHC: 31.3 g/dL (ref 30.0–36.0)
MCHC: 31.9 g/dL (ref 30.0–36.0)
MCV: 96.8 fL (ref 80.0–100.0)
MCV: 95.4 fL (ref 80.0–100.0)
Platelets: 185 10*3/uL (ref 150–400)
Platelets: 157 10*3/uL (ref 150–400)
RBC: 3.76 MIL/uL — ABNORMAL LOW (ref 4.22–5.81)
RBC: 3.51 MIL/uL — ABNORMAL LOW (ref 4.22–5.81)
RDW: 14.7 % (ref 11.5–15.5)
RDW: 15.2 % (ref 11.5–15.5)
WBC: 10.1 10*3/uL (ref 4.0–10.5)
WBC: 11 10*3/uL — ABNORMAL HIGH (ref 4.0–10.5)
nRBC: 0.4 % — ABNORMAL HIGH (ref 0.0–0.2)
nRBC: 0.7 % — ABNORMAL HIGH (ref 0.0–0.2)

## 2019-07-26 LAB — BASIC METABOLIC PANEL
Anion gap: 27 — ABNORMAL HIGH (ref 5–15)
BUN: 42 mg/dL — ABNORMAL HIGH (ref 8–23)
CO2: 8 mmol/L — ABNORMAL LOW (ref 22–32)
Calcium: 6.9 mg/dL — ABNORMAL LOW (ref 8.9–10.3)
Chloride: 102 mmol/L (ref 98–111)
Creatinine, Ser: 2.7 mg/dL — ABNORMAL HIGH (ref 0.61–1.24)
GFR calc Af Amer: 27 mL/min — ABNORMAL LOW (ref >60)
GFR calc non Af Amer: 23 mL/min — ABNORMAL LOW (ref >60)
Glucose, Bld: 89 mg/dL (ref 70–99)
Potassium: 4.1 mmol/L (ref 3.5–5.1)
Sodium: 137 mmol/L (ref 135–145)

## 2019-07-26 LAB — HEMOGLOBIN A1C
Hgb A1c MFr Bld: 6.6 % — ABNORMAL HIGH (ref 4.8–5.6)
Mean Plasma Glucose: 142.72 mg/dL

## 2019-07-26 LAB — GLUCOSE, CAPILLARY
Glucose-Capillary: 51 mg/dL — ABNORMAL LOW (ref 70–99)
Glucose-Capillary: 70 mg/dL (ref 70–99)
Glucose-Capillary: 147 mg/dL — ABNORMAL HIGH (ref 70–99)
Glucose-Capillary: 38 mg/dL — CL (ref 70–99)
Glucose-Capillary: 139 mg/dL — ABNORMAL HIGH (ref 70–99)

## 2019-07-26 LAB — TROPONIN I (HIGH SENSITIVITY): Troponin I (High Sensitivity): 337 ng/L (ref <18)

## 2019-07-26 LAB — LACTIC ACID, PLASMA
Lactic Acid, Venous: >11 mmol/L (ref 0.5–1.9)
Lactic Acid, Venous: >11 mmol/L (ref 0.5–1.9)

## 2019-07-26 LAB — MAGNESIUM
Magnesium: 5.6 mg/dL — ABNORMAL HIGH (ref 1.7–2.4)
Magnesium: 5.2 mg/dL — ABNORMAL HIGH (ref 1.7–2.4)

## 2019-07-26 LAB — PHOSPHORUS
Phosphorus: 8.6 mg/dL — ABNORMAL HIGH (ref 2.5–4.6)
Phosphorus: 7.4 mg/dL — ABNORMAL HIGH (ref 2.5–4.6)

## 2019-07-26 LAB — MRSA PCR SCREENING: MRSA by PCR: NEGATIVE

## 2019-07-26 LAB — ETHANOL: Alcohol, Ethyl (B): <10 mg/dL (ref <10)

## 2019-07-26 MED ORDER — PIPERACILLIN-TAZOBACTAM 3.375 G IVPB
3.3750 g | Freq: Three times a day (TID) | INTRAVENOUS | Status: DC
  Administered 2019-07-26: 3.375 g via INTRAVENOUS
  Filled 2019-07-26: qty 50

## 2019-07-26 MED ORDER — SODIUM BICARBONATE 8.4 % IV SOLN
100.0000 meq | Freq: Once | INTRAVENOUS | Status: AC
  Administered 2019-07-26: 100 meq via INTRAVENOUS
  Filled 2019-07-26: qty 100

## 2019-07-26 MED ORDER — NOREPINEPHRINE 4 MG/250ML-% IV SOLN
0.0000 ug/min | INTRAVENOUS | Status: DC
  Administered 2019-07-26: 30 ug/min via INTRAVENOUS
  Filled 2019-07-26 (×2): qty 250

## 2019-07-26 MED ORDER — ACETAMINOPHEN 650 MG RE SUPP: 650.0000 mg | Freq: Four times a day (QID) | RECTAL | Status: DC | PRN

## 2019-07-26 MED ORDER — GLYCOPYRROLATE 0.2 MG/ML IJ SOLN: 0.2000 mg | INTRAMUSCULAR | Status: DC | PRN

## 2019-07-26 MED ORDER — FENTANYL CITRATE (PF) 100 MCG/2ML IJ SOLN: 25.0000 ug | INTRAMUSCULAR | Status: DC | PRN

## 2019-07-26 MED ORDER — ALBUMIN HUMAN 5 % IV SOLN
25.0000 g | Freq: Once | INTRAVENOUS | Status: AC
  Filled 2019-07-26: qty 250

## 2019-07-26 MED ORDER — DIPHENHYDRAMINE HCL 50 MG/ML IJ SOLN: 25.0000 mg | INTRAMUSCULAR | Status: DC | PRN

## 2019-07-26 MED ORDER — PIPERACILLIN-TAZOBACTAM 3.375 G IVPB 30 MIN
3.3750 g | INTRAVENOUS | Status: AC
  Administered 2019-07-26: 3.375 g via INTRAVENOUS
  Filled 2019-07-26: qty 50

## 2019-07-26 MED ORDER — MORPHINE 100MG IN NS 100ML (1MG/ML) PREMIX INFUSION: 0.0000 mg/h | INTRAVENOUS | Status: DC

## 2019-07-26 MED ORDER — DEXTROSE 50 % IV SOLN
25.0000 g | INTRAVENOUS | Status: AC
  Administered 2019-07-26: 25 g via INTRAVENOUS

## 2019-07-26 MED ORDER — ACETAMINOPHEN 325 MG PO TABS: 650.0000 mg | ORAL_TABLET | Freq: Four times a day (QID) | ORAL | Status: DC | PRN

## 2019-07-26 MED ORDER — NOREPINEPHRINE 4 MG/250ML-% IV SOLN
INTRAVENOUS | Status: AC
  Administered 2019-07-26: 25 mg via INTRAVENOUS
  Filled 2019-07-26: qty 250

## 2019-07-26 MED ORDER — ALBUMIN HUMAN 5 % IV SOLN
INTRAVENOUS | Status: AC
  Administered 2019-07-26: 25 g via INTRAVENOUS
  Filled 2019-07-26: qty 250

## 2019-07-26 MED ORDER — SODIUM CHLORIDE 0.9 % IV BOLUS
1000.0000 mL | Freq: Once | INTRAVENOUS | Status: AC
  Administered 2019-07-26: 1000 mL via INTRAVENOUS

## 2019-07-26 MED ORDER — DEXTROSE 50 % IV SOLN
25.0000 g | INTRAVENOUS | Status: AC
  Administered 2019-07-26: 25 g via INTRAVENOUS
  Filled 2019-07-26: qty 100

## 2019-07-26 MED ORDER — GLYCOPYRROLATE 1 MG PO TABS
1.0000 mg | ORAL_TABLET | ORAL | Status: DC | PRN
  Filled 2019-07-26: qty 1

## 2019-07-26 MED ORDER — ORAL CARE MOUTH RINSE
15.0000 mL | OROMUCOSAL | Status: DC
  Administered 2019-07-26 (×2): 15 mL via OROMUCOSAL

## 2019-07-26 MED ORDER — MORPHINE BOLUS VIA INFUSION
5.0000 mg | INTRAVENOUS | Status: DC | PRN
  Filled 2019-07-26: qty 5

## 2019-07-26 MED ORDER — POLYVINYL ALCOHOL 1.4 % OP SOLN
1.0000 [drp] | Freq: Four times a day (QID) | OPHTHALMIC | Status: DC | PRN
  Filled 2019-07-26: qty 15

## 2019-07-26 MED ORDER — MORPHINE SULFATE (PF) 2 MG/ML IV SOLN: 2.0000 mg | INTRAVENOUS | Status: DC | PRN

## 2019-07-26 MED ORDER — CHLORHEXIDINE GLUCONATE 0.12% ORAL RINSE (MEDLINE KIT)
15.0000 mL | Freq: Two times a day (BID) | OROMUCOSAL | Status: DC
  Administered 2019-07-26: 15 mL via OROMUCOSAL

## 2019-07-26 MED ORDER — INSULIN ASPART 100 UNIT/ML ~~LOC~~ SOLN: 0.0000 [IU] | SUBCUTANEOUS | Status: DC

## 2019-07-26 MED ORDER — HALOPERIDOL LACTATE 5 MG/ML IJ SOLN: 2.5000 mg | INTRAMUSCULAR | Status: DC | PRN

## 2019-07-26 MED ORDER — VASOPRESSIN 20 UNIT/ML IV SOLN
0.0300 [IU]/min | INTRAVENOUS | Status: DC
  Administered 2019-07-26: 0.03 [IU]/min via INTRAVENOUS
  Filled 2019-07-26: qty 2

## 2019-07-26 MED ORDER — DEXTROSE 10 % IV SOLN: INTRAVENOUS | Status: DC

## 2019-07-29 NOTE — Progress Notes (Signed)
Patient transported from ED RESUS to [id number] with no complications.

## 2019-07-29 NOTE — Progress Notes (Signed)
   2019/08/22 0000  Clinical Encounter Type  Visited With Health care provider  Visit Type Initial;ED  Referral From Physician   Spoke w/ Dr. Via telephone and RN Jess in person. Chaplain available via page as desired.  Temple Pacini [place], [phone]

## 2019-07-29 NOTE — Progress Notes (Signed)
Pharmacy Antibiotic Note  Cody Mcdowell is a 69 y.o. male admitted on 07/01/2019 with CT findings consistent with bowel ischemia likely bowel perforation.  Pharmacy has been consulted for Zosyn dosing.  Plan: Zosyn 3.375g IV q8h (4 hour infusion).  Will f/u renal function, micro data, and pt's clinical condition  Height: 5\' 11"  (180.3 cm) IBW/kg (Calculated) : 75.3  Temp (24hrs), Avg:93 F (33.9 C), Min:91.8 F (33.2 C), Max:98.2 F (36.8 C)  Recent Labs  Lab 07/04/2019 2022 07/12/2019 2045 07/24/2019 2046  WBC  --   --  16.3*  CREATININE 2.90*  --  3.13*  LATICACIDVEN  --  >11.0*  --     CrCl cannot be calculated (Unknown ideal weight.).    Not on File  Antimicrobials this admission: 12/28 Vanc x1 12/28 Cefepime x 1 12/29 Zosyn >>   Microbiology results: 12/28 BCx:  12/28 UCx: 12/28 COVID-19/Flu A/B: negative  Thank you for allowing pharmacy to be a part of this patient's care.  Sherlon Handing, PharmD, BCPS Please see amion for complete clinical pharmacist phone list 19-Aug-2019 12:21 AM

## 2019-07-29 NOTE — Progress Notes (Signed)
PCCM INTERVAL PROGRESS NOTE   Eliezer Champagne updated on surgical recommendations. Given poor prognosis she has elected for no escalation of care. Should he deteriorate she would like to be contacted and would likely transition to comfort care, which would be in line with his wishes. She will likely come to see him in the morning.       Georgann Housekeeper, AGACNP-BC [place]  See Amion for personal pager PCCM on call pager [phone]  2019-08-12 1:19 AM

## 2019-07-29 NOTE — Progress Notes (Signed)
EEG complete - results pending 

## 2019-07-29 NOTE — Progress Notes (Signed)
Hypoglycemic Event  CBG: 38  Treatment: D50 50 mL (25 gm)  Given at 0824   Symptoms: None  Follow-up CBG: [id number] CBG Result:139  Possible Reasons for Event: Inadequate meal intake  Comments/MD notified: will discuss with MD during AM rounds.     Cody Mcdowell A

## 2019-07-29 NOTE — Consult Note (Signed)
Reason for Consult: pneumatosis Referring Physician: Fortino Sic is an 69 y.o. male.  HPI: 69 yo male with distant history of pancreatic cancer. He was complaining of abdominal pain for a few days and then was found down unresponsive. He underwent CPR for 7 minutes with ROSC.  Past Medical History:  Diagnosis Date  . Cancer [facility])     No family history on file.  Social History:  has no history on file for tobacco, alcohol, and drug.  Allergies: Not on File  Medications: I have reviewed the patient's current medications.  Results for orders placed or performed during the hospital encounter of 07/18/2019 (from the past 48 hour(s))  Urinalysis, Routine w reflex microscopic     Status: Abnormal   Collection Time: 07/28/2019  8:14 PM  Result Value Ref Range   Color, Urine YELLOW YELLOW   APPearance HAZY (A) CLEAR   Specific Gravity, Urine 1.016 1.005 - 1.030   pH 5.0 5.0 - 8.0   Glucose, UA 50 (A) NEGATIVE mg/dL   Hgb urine dipstick MODERATE (A) NEGATIVE   Bilirubin Urine NEGATIVE NEGATIVE   Ketones, ur NEGATIVE NEGATIVE mg/dL   Protein, ur 30 (A) NEGATIVE mg/dL   Nitrite NEGATIVE NEGATIVE   Leukocytes,Ua NEGATIVE NEGATIVE   RBC / HPF 0-5 0 - 5 RBC/hpf   WBC, UA 0-5 0 - 5 WBC/hpf   Bacteria, UA RARE (A) NONE SEEN   Squamous Epithelial / LPF 0-5 0 - 5   Mucus PRESENT    Hyaline Casts, UA PRESENT    Amorphous Crystal PRESENT     Comment: Performed at [facility], [address], [place], [place] [address]  POCT I-Stat EG7     Status: Abnormal   Collection Time: 07/24/2019  8:21 PM  Result Value Ref Range   pH, Ven 6.865 (LL) 7.250 - 7.430   pCO2, Ven 49.8 44.0 - 60.0 mmHg   pO2, Ven 90.0 (H) 32.0 - 45.0 mmHg   Bicarbonate 9.0 (L) 20.0 - 28.0 mmol/L   TCO2 10 (L) 22 - 32 mmol/L   O2 Saturation 87.0 %   Acid-base deficit 25.0 (H) 0.0 - 2.0 mmol/L   Sodium 134 (L) 135 - 145 mmol/L   Potassium 3.8 3.5 - 5.1 mmol/L   Calcium, Ion 1.00 (L) 1.15 - 1.40  mmol/L   HCT 43.0 39.0 - 52.0 %   Hemoglobin 14.6 13.0 - 17.0 g/dL   Patient temperature HIDE    Sample type VENOUS    Comment NOTIFIED PHYSICIAN   I-stat chem 8, ED (not at [facility] or [facility])     Status: Abnormal   Collection Time: 07/20/2019  8:22 PM  Result Value Ref Range   Sodium 133 (L) 135 - 145 mmol/L   Potassium 4.0 3.5 - 5.1 mmol/L   Chloride 102 98 - 111 mmol/L   BUN 50 (H) 8 - 23 mg/dL   Creatinine, Ser 2.90 (H) 0.61 - 1.24 mg/dL   Glucose, Bld 180 (H) 70 - 99 mg/dL   Calcium, Ion 1.00 (L) 1.15 - 1.40 mmol/L   TCO2 11 (L) 22 - 32 mmol/L   Hemoglobin 14.6 13.0 - 17.0 g/dL   HCT 43.0 39.0 - 52.0 %  POC occult blood, ED     Status: Abnormal   Collection Time: 07/16/2019  8:32 PM  Result Value Ref Range   Fecal Occult Bld POSITIVE (A) NEGATIVE  Respiratory Panel by RT PCR (Flu A&B, Covid) - Nasopharyngeal Swab  Status: None   Collection Time: 07/12/2019  8:35 PM   Specimen: Nasopharyngeal Swab  Result Value Ref Range   SARS Coronavirus 2 by RT PCR NEGATIVE NEGATIVE    Comment: (NOTE) SARS-CoV-2 target nucleic acids are NOT DETECTED. The SARS-CoV-2 RNA is generally detectable in upper respiratoy specimens during the acute phase of infection. The lowest concentration of SARS-CoV-2 viral copies this assay can detect is 131 copies/mL. A negative result does not preclude SARS-Cov-2 infection and should not be used as the sole basis for treatment or other patient management decisions. A negative result may occur with  improper specimen collection/handling, submission of specimen other than nasopharyngeal swab, presence of viral mutation(s) within the areas targeted by this assay, and inadequate number of viral copies (<131 copies/mL). A negative result must be combined with clinical observations, patient history, and epidemiological information. The expected result is Negative. Fact Sheet for Patients:  PinkCheek.be Fact Sheet for Healthcare  Providers:  GravelBags.it This test is not yet ap proved or cleared by the [place] FDA and  has been authorized for detection and/or diagnosis of SARS-CoV-2 by FDA under an Emergency Use Authorization (EUA). This EUA will remain  in effect (meaning this test can be used) for the duration of the COVID-19 declaration under Section 564(b)(1) of the Act, 21 U.S.C. section 360bbb-3(b)(1), unless the authorization is terminated or revoked sooner.    Influenza A by PCR NEGATIVE NEGATIVE   Influenza B by PCR NEGATIVE NEGATIVE    Comment: (NOTE) The Xpert Xpress SARS-CoV-2/FLU/RSV assay is intended as an aid in  the diagnosis of influenza from Nasopharyngeal swab specimens and  should not be used as a sole basis for treatment. Nasal washings and  aspirates are unacceptable for Xpert Xpress SARS-CoV-2/FLU/RSV  testing. Fact Sheet for Patients: PinkCheek.be Fact Sheet for Healthcare Providers: GravelBags.it This test is not yet approved or cleared by the [place] FDA and  has been authorized for detection and/or diagnosis of SARS-CoV-2 by  FDA under an Emergency Use Authorization (EUA). This EUA will remain  in effect (meaning this test can be used) for the duration of the  Covid-19 declaration under Section 564(b)(1) of the Act, 21  U.S.C. section 360bbb-3(b)(1), unless the authorization is  terminated or revoked. Performed at [facility], [place] [address] [place], [place] [address]   Lactic acid, plasma     Status: Abnormal   Collection Time: 07/15/2019  8:45 PM  Result Value Ref Range   Lactic Acid, Venous >11.0 (HH) 0.5 - 1.9 mmol/L    Comment: CRITICAL RESULT CALLED TO, READ BACK BY AND VERIFIED WITH: RN J FERRAINOLO @2203  07/22/2019 BY S GEZAHEGN Performed at [facility], [place] [address], [place], [place] [address]   Comprehensive metabolic panel     Status: Abnormal    Collection Time: 07/05/2019  8:46 PM  Result Value Ref Range   Sodium 137 135 - 145 mmol/L   Potassium 4.0 3.5 - 5.1 mmol/L   Chloride 95 (L) 98 - 111 mmol/L   CO2 10 (L) 22 - 32 mmol/L   Glucose, Bld 192 (H) 70 - 99 mg/dL   BUN 45 (H) 8 - 23 mg/dL   Creatinine, Ser 3.13 (H) 0.61 - 1.24 mg/dL   Calcium 9.2 8.9 - 10.3 mg/dL   Total Protein 6.3 (L) 6.5 - 8.1 g/dL   Albumin 2.9 (L) 3.5 - 5.0 g/dL   AST 181 (H) 15 - 41 U/L   ALT <5 0 -  44 U/L   Alkaline Phosphatase 94 38 - 126 U/L   Total Bilirubin 1.2 0.3 - 1.2 mg/dL   GFR calc non Af Amer 19 (L) >60 mL/min   GFR calc Af Amer 22 (L) >60 mL/min   Anion gap 32 (H) 5 - 15    Comment: Performed at [facility] [address], [place], [place] [address]  CBC with Differential     Status: Abnormal   Collection Time: 07/17/2019  8:46 PM  Result Value Ref Range   WBC 16.3 (H) 4.0 - 10.5 K/uL   RBC 4.53 4.22 - 5.81 MIL/uL   Hemoglobin 13.7 13.0 - 17.0 g/dL   HCT 44.8 39.0 - 52.0 %   MCV 98.9 80.0 - 100.0 fL   MCH 30.2 26.0 - 34.0 pg   MCHC 30.6 30.0 - 36.0 g/dL   RDW 14.3 11.5 - 15.5 %   Platelets 207 150 - 400 K/uL   nRBC 0.2 0.0 - 0.2 %   Neutrophils Relative % 72 %   Neutro Abs 11.8 (H) 1.7 - 7.7 K/uL   Lymphocytes Relative 14 %   Lymphs Abs 2.2 0.7 - 4.0 K/uL   Monocytes Relative 11 %   Monocytes Absolute 1.8 (H) 0.1 - 1.0 K/uL   Eosinophils Relative 0 %   Eosinophils Absolute 0.1 0.0 - 0.5 K/uL   Basophils Relative 1 %   Basophils Absolute 0.1 0.0 - 0.1 K/uL   Immature Granulocytes 2 %   Abs Immature Granulocytes 0.34 (H) 0.00 - 0.07 K/uL    Comment: Performed at [facility] [address], [place], [place] [address]  Troponin I (High Sensitivity)     Status: Abnormal   Collection Time: 07/11/2019  8:46 PM  Result Value Ref Range   Troponin I (High Sensitivity) 80 (H) <18 ng/L    Comment: (NOTE) Elevated high sensitivity troponin I (hsTnI) values and significant  changes across serial measurements may suggest ACS  but many other  chronic and acute conditions are known to elevate hsTnI results.  Refer to the "Links" section for chest pain algorithms and additional  guidance. Performed at [facility], [place] [address], [place], [place] [address]   Type and screen [place]     Status: None   Collection Time: 07/09/2019  8:46 PM  Result Value Ref Range   ABO/RH(D) B POS    Antibody Screen NEG    Sample Expiration      07/28/2019,2359 Performed at [facility], [place] [address], [place], [place] [address]   Lipase, blood     Status: None   Collection Time: 07/19/2019  8:46 PM  Result Value Ref Range   Lipase 13 11 - 51 U/L    Comment: Performed at [facility] [address] [place], [place] [address]  ABO/Rh     Status: None   Collection Time: 07/17/2019  8:46 PM  Result Value Ref Range   ABO/RH(D)      B POS Performed at [facility] [address] [place],  [address]   Protime-INR     Status: Abnormal   Collection Time: 07/09/2019  8:52 PM  Result Value Ref Range   Prothrombin Time 17.5 (H) 11.4 - 15.2 seconds   INR 1.5 (H) 0.8 - 1.2    Comment: (NOTE) INR goal varies based on device and disease states. Performed at [facility], [place] [address]  [place], [place] [address]   Rapid urine drug screen (hospital performed)     Status: None   Collection Time: 07/15/2019  8:52 PM  Result Value Ref Range   Opiates NONE DETECTED NONE DETECTED   Cocaine NONE DETECTED NONE DETECTED   Benzodiazepines NONE DETECTED NONE DETECTED   Amphetamines NONE DETECTED NONE DETECTED   Tetrahydrocannabinol NONE DETECTED NONE DETECTED   Barbiturates NONE DETECTED NONE DETECTED    Comment: (NOTE) DRUG SCREEN FOR MEDICAL PURPOSES ONLY.  IF CONFIRMATION IS NEEDED FOR ANY PURPOSE, NOTIFY LAB WITHIN 5 DAYS. LOWEST DETECTABLE LIMITS FOR URINE DRUG SCREEN Drug Class                     Cutoff (ng/mL) Amphetamine and metabolites    1000 Barbiturate and  metabolites    200 Benzodiazepine                 010 Tricyclics and metabolites     300 Opiates and metabolites        300 Cocaine and metabolites        300 THC                            50 Performed at [facility], [place] [address], [place], [place] [address]   POC SARS Coronavirus 2 Ag-ED - Nasal Swab (BD Veritor Kit)     Status: None   Collection Time: 07/14/2019  8:58 PM  Result Value Ref Range   SARS Coronavirus 2 Ag NEGATIVE NEGATIVE    Comment: (NOTE) SARS-CoV-2 antigen NOT DETECTED.  Negative results are presumptive.  Negative results do not preclude SARS-CoV-2 infection and should not be used as the sole basis for treatment or other patient management decisions, including infection  control decisions, particularly in the presence of clinical signs and  symptoms consistent with COVID-19, or in those who have been in contact with the virus.  Negative results must be combined with clinical observations, patient history, and epidemiological information. The expected result is Negative. Fact Sheet for Patients: PodPark.tn Fact Sheet for Healthcare Providers: GiftContent.is This test is not yet approved or cleared by the [place] FDA and  has been authorized for detection and/or diagnosis of SARS-CoV-2 by FDA under an Emergency Use Authorization (EUA).  This EUA will remain in effect (meaning this test can be used) for the duration of  the COVID-19 de claration under Section 564(b)(1) of the Act, 21 U.S.C. section 360bbb-3(b)(1), unless the authorization is terminated or revoked sooner.   I-STAT 7, (LYTES, BLD GAS, ICA, H+H)     Status: Abnormal   Collection Time: 06/29/2019  9:15 PM  Result Value Ref Range   pH, Arterial 6.965 (LL) 7.350 - 7.450   pCO2 arterial 51.3 (H) 32.0 - 48.0 mmHg   pO2, Arterial 115.0 (H) 83.0 - 108.0 mmHg   Bicarbonate 12.2 (L) 20.0 - 28.0 mmol/L   TCO2 14 (L) 22 - 32 mmol/L   O2  Saturation 96.0 %   Acid-base deficit 20.0 (H) 0.0 - 2.0 mmol/L   Sodium 135 135 - 145 mmol/L   Potassium 4.2 3.5 - 5.1 mmol/L   Calcium, Ion 1.01 (L) 1.15 - 1.40 mmol/L   HCT 28.0 (L) 39.0 - 52.0 %   Hemoglobin 9.5 (L) 13.0 - 17.0 g/dL   Patient temperature 93.8 F    Collection site RADIAL, ALLEN'S TEST ACCEPTABLE    Drawn by RT  Sample type ARTERIAL    Comment NOTIFIED PHYSICIAN   Ethanol     Status: None   Collection Time: 07/02/2019 11:31 PM  Result Value Ref Range   Alcohol, Ethyl (B) <10 <10 mg/dL    Comment: (NOTE) Lowest detectable limit for serum alcohol is 10 mg/dL. For medical purposes only. Performed at [facility], [place] [address] [place], [place] [address]   Lactic acid, plasma     Status: Abnormal   Collection Time: 06/30/2019 11:38 PM  Result Value Ref Range   Lactic Acid, Venous >11.0 (HH) 0.5 - 1.9 mmol/L    Comment: CRITICAL VALUE NOTED.  VALUE IS CONSISTENT WITH PREVIOUSLY REPORTED AND CALLED VALUE. Performed at [facility], [place] [address] [place], [place] [address]   I-STAT 7, (LYTES, BLD GAS, ICA, H+H)     Status: Abnormal   Collection Time: July 29, 2019 12:45 AM  Result Value Ref Range   pH, Arterial 7.134 (LL) 7.350 - 7.450   pCO2 arterial 27.1 (L) 32.0 - 48.0 mmHg   pO2, Arterial 92.0 83.0 - 108.0 mmHg   Bicarbonate 9.6 (L) 20.0 - 28.0 mmol/L   TCO2 11 (L) 22 - 32 mmol/L   O2 Saturation 96.0 %   Acid-base deficit 19.0 (H) 0.0 - 2.0 mmol/L   Sodium 135 135 - 145 mmol/L   Potassium 4.1 3.5 - 5.1 mmol/L   Calcium, Ion 0.92 (L) 1.15 - 1.40 mmol/L   HCT 32.0 (L) 39.0 - 52.0 %   Hemoglobin 10.9 (L) 13.0 - 17.0 g/dL   Patient temperature 92.1 F    Collection site RADIAL, ALLEN'S TEST ACCEPTABLE    Drawn by RT    Sample type ARTERIAL    Comment NOTIFIED PHYSICIAN     CT ABDOMEN PELVIS WO CONTRAST  Result Date: 06/30/2019 CLINICAL DATA:  Shortness of breath. Found unresponsive in the bathroom. Post CPR. History of pancreatic cancer.  Abdominal distension; Shortness of breath EXAM: CT CHEST, ABDOMEN AND PELVIS WITHOUT CONTRAST TECHNIQUE: Multidetector CT imaging of the chest, abdomen and pelvis was performed following the standard protocol without IV contrast. COMPARISON:  Chest radiograph earlier this day. No remote exams available. FINDINGS: CT CHEST FINDINGS Cardiovascular: Aortic atherosclerosis. No aortic aneurysm. Heart is normal in size. No pericardial effusion. Mediastinum/Nodes: 1 endotracheal tube tip above the carina. Enteric tube in place, despite anterior to the esophagus is dilated and fluid-filled. Limited assessment for adenopathy given lack of IV contrast, no bulky mediastinal or hilar adenopathy. Slightly heterogeneous thyroid gland without dominant nodule. Lungs/Pleura: Consolidation in the dependent right lower lobe. Minimal dependent atelectasis in the left lower lobe. Minimal debris in the mainstem bronchi. Mild central bronchial thickening. Small ground-glass opacity in the anterior right middle lobe may be pulmonary contusion. No pleural fluid. No definite pulmonary nodule or mass, breathing motion artifact limits assessment. Musculoskeletal: Advanced degenerative change of the shoulders. Motion artifact through the sternum limits assessment for fracture. Motion artifact through the anterior ribs limits assessment for fracture. No evidence of focal bone lesion. CT ABDOMEN PELVIS FINDINGS Hepatobiliary: Branching air in the left greater than right lobe of the liver, favor mesenteric gas or pneumobilia. Post cholecystectomy. Limited assessment for focal liver lesion given motion and lack contrast. Pancreas: No definite pancreatic tissue visualized, multiple surgical clips suggesting prior Whipple procedure. Spleen: No definite splenic tissue visualized, suspected splenectomy. Adrenals/Urinary Tract: No dominant adrenal nodule. No hydronephrosis. There is bilateral perinephric edema. Foley catheter decompresses the urinary  bladder. Stomach/Bowel: Enteric tube tip in  the stomach. Gastric anatomy is not well-defined. There is pneumatosis involving the cecum and ascending colon with mottled air in the adjacent mesentery and free air in the anterior right abdomen. Small and large bowel diffusely fluid-filled and dilated. Mesenteric venous air is most prominent in the ileocolic mesentery, but also seen centrally. Vascular/Lymphatic: Aortic atherosclerosis. Air in the mesenteric veins of the ileocolic mesentery. Limited assessment for adenopathy. Reproductive: Prostate gland grossly normal, partially obscured by external artifact. Other: Mesenteric edema, air, and small amount of free fluid. Minimal ascites/free fluid in the upper abdomen. Musculoskeletal: No focal bone lesion. IMPRESSION: 1. Cecal and ascending colonic pneumatosis with mottled air in the adjacent mesentery, mesenteric venous gas and multiple foci free air in the right abdomen. Findings consistent with bowel ischemia and probable bowel perforation given the free air. 2. Branching air in the left greater than right lobe of the liver, favor mesenteric gas over pneumobilia. 3. Small and large bowel diffusely fluid-filled and dilated, likely secondary to ileus. 4. Consolidation in the dependent right lower lobe, may be atelectasis, aspiration or pneumonia. Minimal debris in the mainstem bronchi. 5. Probable prior Whipple procedure, recommend correlation with surgical history. 6. Bilateral perinephric edema, nonspecific. Aortic Atherosclerosis (ICD10-I70.0). Critical Value/emergent results were called by telephone at the time of interpretation on 07/23/2019 at 11:06 pm to [place] , who verbally acknowledged these results. Electronically Signed   By: Keith Rake M.D.   On: 06/30/2019 23:08   CT Head Wo Contrast  Result Date: 07/24/2019 CLINICAL DATA:  Initial evaluation for acute headache.  Trauma. EXAM: CT HEAD WITHOUT CONTRAST TECHNIQUE: Contiguous axial  images were obtained from the base of the skull through the vertex without intravenous contrast. COMPARISON:  None. FINDINGS: Brain: Mild age-related cerebral atrophy with chronic small vessel ischemic disease. Few scattered subcentimeter hypodensities involving the deep white matter of the right cerebral hemisphere could reflect small remote lacunar infarcts versus dilated perivascular spaces. No evidence for acute intracranial hemorrhage. No findings to suggest acute large vessel territory infarct. No mass lesion, midline shift, or mass effect. Ventricles are normal in size without evidence for hydrocephalus. No extra-axial fluid collection identified. Vascular: No hyperdense vessel identified. Skull: Scalp soft tissues demonstrate no acute abnormality. Calvarium intact. Sinuses/Orbits: Globes and orbital soft tissues within normal limits. Scattered mucosal thickening noted within the ethmoidal air cells. Paranasal sinuses are otherwise clear. No mastoid effusion. IMPRESSION: 1. No acute intracranial abnormality. 2. Mild age-related cerebral atrophy with chronic small vessel ischemic disease. Electronically Signed   By: Jeannine Boga M.D.   On: 07/14/2019 23:03   CT Chest Wo Contrast  Result Date: 07/15/2019 CLINICAL DATA:  Shortness of breath. Found unresponsive in the bathroom. Post CPR. History of pancreatic cancer. Abdominal distension; Shortness of breath EXAM: CT CHEST, ABDOMEN AND PELVIS WITHOUT CONTRAST TECHNIQUE: Multidetector CT imaging of the chest, abdomen and pelvis was performed following the standard protocol without IV contrast. COMPARISON:  Chest radiograph earlier this day. No remote exams available. FINDINGS: CT CHEST FINDINGS Cardiovascular: Aortic atherosclerosis. No aortic aneurysm. Heart is normal in size. No pericardial effusion. Mediastinum/Nodes: 1 endotracheal tube tip above the carina. Enteric tube in place, despite anterior to the esophagus is dilated and fluid-filled.  Limited assessment for adenopathy given lack of IV contrast, no bulky mediastinal or hilar adenopathy. Slightly heterogeneous thyroid gland without dominant nodule. Lungs/Pleura: Consolidation in the dependent right lower lobe. Minimal dependent atelectasis in the left lower lobe. Minimal debris in the mainstem bronchi. Mild central bronchial thickening. Small  ground-glass opacity in the anterior right middle lobe may be pulmonary contusion. No pleural fluid. No definite pulmonary nodule or mass, breathing motion artifact limits assessment. Musculoskeletal: Advanced degenerative change of the shoulders. Motion artifact through the sternum limits assessment for fracture. Motion artifact through the anterior ribs limits assessment for fracture. No evidence of focal bone lesion. CT ABDOMEN PELVIS FINDINGS Hepatobiliary: Branching air in the left greater than right lobe of the liver, favor mesenteric gas or pneumobilia. Post cholecystectomy. Limited assessment for focal liver lesion given motion and lack contrast. Pancreas: No definite pancreatic tissue visualized, multiple surgical clips suggesting prior Whipple procedure. Spleen: No definite splenic tissue visualized, suspected splenectomy. Adrenals/Urinary Tract: No dominant adrenal nodule. No hydronephrosis. There is bilateral perinephric edema. Foley catheter decompresses the urinary bladder. Stomach/Bowel: Enteric tube tip in the stomach. Gastric anatomy is not well-defined. There is pneumatosis involving the cecum and ascending colon with mottled air in the adjacent mesentery and free air in the anterior right abdomen. Small and large bowel diffusely fluid-filled and dilated. Mesenteric venous air is most prominent in the ileocolic mesentery, but also seen centrally. Vascular/Lymphatic: Aortic atherosclerosis. Air in the mesenteric veins of the ileocolic mesentery. Limited assessment for adenopathy. Reproductive: Prostate gland grossly normal, partially obscured  by external artifact. Other: Mesenteric edema, air, and small amount of free fluid. Minimal ascites/free fluid in the upper abdomen. Musculoskeletal: No focal bone lesion. IMPRESSION: 1. Cecal and ascending colonic pneumatosis with mottled air in the adjacent mesentery, mesenteric venous gas and multiple foci free air in the right abdomen. Findings consistent with bowel ischemia and probable bowel perforation given the free air. 2. Branching air in the left greater than right lobe of the liver, favor mesenteric gas over pneumobilia. 3. Small and large bowel diffusely fluid-filled and dilated, likely secondary to ileus. 4. Consolidation in the dependent right lower lobe, may be atelectasis, aspiration or pneumonia. Minimal debris in the mainstem bronchi. 5. Probable prior Whipple procedure, recommend correlation with surgical history. 6. Bilateral perinephric edema, nonspecific. Aortic Atherosclerosis (ICD10-I70.0). Critical Value/emergent results were called by telephone at the time of interpretation on 07/09/2019 at 11:06 pm to [place] , who verbally acknowledged these results. Electronically Signed   By: Keith Rake M.D.   On: 07/24/2019 23:08   DG Chest Portable 1 View  Result Date: 07/15/2019 CLINICAL DATA:  Hypoxia EXAM: PORTABLE CHEST 1 VIEW COMPARISON:  None. FINDINGS: Endotracheal tube tip is 7.1 cm above the carina. Nasogastric tube tip and side port are in the stomach. No pneumothorax. Lungs are clear. Heart size and pulmonary vascularity are normal. No adenopathy. There is extensive arthropathy in each shoulder. There are multiple surgical clips in the upper abdomen. IMPRESSION: Tube positions as described without pneumothorax. No edema or consolidation. Cardiac silhouette normal. Electronically Signed   By: Lowella Grip III M.D.   On: 07/06/2019 20:33   Review of Systems  Unable to perform ROS: Acuity of condition   Blood pressure 90/65, pulse (!) 110, temperature (!)  92.1 F (33.4 C), resp. rate (!) 24, height 5' 11"  (1.803 m), SpO2 100 %. Physical Exam  Constitutional: He appears well-developed and well-nourished.  HENT:  Head: Normocephalic.  Cardiovascular:  tachycardic  Respiratory:  ventilated  GI:  Moderate distension  Neurological:  Minimal response to voice or touch  Skin: Skin is dry.   Assessment/Plan: 69 yo male with arrest. Findings concerning for bowel ischemia. Given arrest and persistent lactate > 11 and pH < 7, I do not think that  surgery would change the outcome of his care.   Arta Bruce Arsh Feutz 08/11/19, 12:50 AM

## 2019-07-29 NOTE — Procedures (Signed)
Patient Name: Cody Mcdowell  MRN: [id number]  Epilepsy Attending: Lora Havens  Referring Physician/Provider: Georgann Housekeeper, NP Date: 09-Aug-2019 Duration: 25.22 minutes  Patient history: 69 year old male with cardiac arrest, pancreatic cancer.  EEG to evaluate for seizures.  Level of alertness: Comatose  AEDs during EEG study: None  Technical aspects: This EEG study was done with scalp electrodes positioned according to the 10-20 International system of electrode placement. Electrical activity was acquired at a sampling rate of 500Hz  and reviewed with a high frequency filter of 70Hz  and a low frequency filter of 1Hz . EEG data were recorded continuously and digitally stored.   Description: EEG showed continuous generalized 2 to 3 Hz delta slowing.  Patient was noted to be chewing on the ET tube.  Concomitant EEG before during and after the event did not show any EEG change to suggest seizure.  EEG was reactive to tactile stimulation.  Hyperventilation and photic summation were not performed.  Abnormality -Continuous slow, generalized  IMPRESSION: This study is suggestive of profound diffuse encephalopathy, nonspecific etiology.  One episode of patient chewing on the ET tube was noted without any concomitant EEG change and was therefore not epileptic.  No seizures or epileptiform discharges were seen throughout the recording.  Aquinnah Devin Barbra Sarks

## 2019-07-29 NOTE — Progress Notes (Signed)
[facility] Progress Note Patient Name: Cody Mcdowell DOB: Sep 13, 1950 MRN: [id number]   Date of Service  08/04/2019  HPI/Events of Note  Pt with worsening respiratory acidosis  eICU Interventions  Sodium bicarbonate 2 amps iv push x 1        Davy [place] U Barbarita Hutmacher 08/04/19, 2:57 AM

## 2019-07-29 NOTE — Discharge Summary (Signed)
Physician Death Summary  Patient ID: Cody Mcdowell MRN: [id number] DOB/AGE: [birth year] 69 y.o.  Admit date: 07/22/2019 Discharge date: Aug 15, 2019  Admission Diagnoses: Cardiac arrest  Discharge Diagnoses:  Active Problems:   Cardiac arrest [facility]) Septic shock Perforated bowel Multiorgan failure  Discharged Condition: Deceased  Hospital Course:  69 year old male past medical history as below, which is significant for pancreatic cancer in the 90s.  Other medical history unknown at this time.  According to an aunt he has been complaining of abdominal pain nausea/vomiting for the past 3 days.  He was last seen well around 1830 on 12/20 and was found approximately 30 minutes later by his wife down on the floor the bathroom.  EMS was called and was there very shortly initiating CPR.  ACLS was undertaken for approximately 7 minutes prior to ROSC.  Upon arrival to the emergency department he was intubated.  Laboratory evaluation significant for creatinine 3.13, bicarb 10, AST 181, lactic acid greater than 11, pH 6.97, WBC 16.3.  CT scan of the abdomen concerning for bowel ischemia and probable bowel perforation.  PCCM asked to admit.  Evaluated by surgery who felt he is not a surgical candidate.  Overnight he has remained on 2 pressors, anuric, shock liver, acidotic, hypoglycemic with frank blood in OGT, hypothermic. Discussed with his aunt [name] over the telephone who reviewed his advanced directives.  She requested transition to comfort care and orders placed for terminal withdrawal of care  Signed: Marshell Garfinkel MD [facility] Aug 15, 2019, 10:34 AM

## 2019-07-29 NOTE — Progress Notes (Signed)
Patient time of death 36.  No heart and lung sounds auscultated for 2 minutes by Doretha Sou RN and Griffin Dakin RN.    No family at bedside.  Next of kin [name] (aunt) notified via telephone of time of death.  No personal belongings at bedside except for necklace on patient, next of kin notified the necklace will stay with the patient.  MD Mannam notified and advised will sign the death certificate.

## 2019-07-29 NOTE — Progress Notes (Signed)
NAME:  Cody Mcdowell, MRN:  [id number], DOB:  Nov 11, 1950, LOS: 1 ADMISSION DATE:  06/28/2019, CONSULTATION DATE: 12/28 REFERRING MD: Dr. Ronnald Nian EDP, CHIEF COMPLAINT: Cardiac arrest  Brief History   69 year old male found down in bathroom unresponsive.  PEA on EMS arrival.  ROSC after 7 minutes of ACLS.  Unresponsive in ED  History of present illness   69 year old male past medical history as below, which is significant for pancreatic cancer in the 90s.  Other medical history unknown at this time.  According to an aunt he has been complaining of abdominal pain nausea/vomiting for the past 3 days.  He was last seen well around 1830 on 12/20 and was found approximately 30 minutes later by his wife down on the floor the bathroom.  EMS was called and was there very shortly initiating CPR.  ACLS was undertaken for approximately 7 minutes prior to ROSC.  Upon arrival to the emergency department he was intubated.  Laboratory evaluation significant for creatinine 3.13, bicarb 10, AST 181, lactic acid greater than 11, pH 6.97, WBC 16.3.  CT scan of the abdomen concerning for bowel ischemia and probable bowel perforation.  PCCM asked to admit.   Past Medical History   has a past medical history of Cancer ([place]).   Significant Hospital Events   12/28 admit  Consults:    Procedures:  ETT 12/28 >  Significant Diagnostic Tests:  CT head 12/28 > No acute intracranial abnormality CT chest, abd, pelv 12/28 > findings consistent with bowel ischemia and probable bowel perforation. Air in the left > right lobe of the liver. Prior whipple.   Micro Data:  Blood 12/28 >  Antimicrobials:  Zosyn 12/28 >  Interim history/subjective:  Made DNR by NOK overnight. On 2 pressors, anuric, shock liver, acidotic, hypoglycemic. Frank blood in OGT. Hypothermic.  Objective   Blood pressure (!) 103/58, pulse (!) 102, temperature (!) 94.8 F (34.9 C), resp. rate (!) 0, height 5\' 11"  (1.803 m), weight 74 kg,  SpO2 100 %.    Vent Mode: PRVC FiO2 (%):  [50 %-100 %] 50 % Set Rate:  [20 bmp-24 bmp] 20 bmp Vt Set:  [600 mL] 600 mL PEEP:  [5 cmH20] 5 cmH20 Plateau Pressure:  [17 cmH20-22 cmH20] 22 cmH20   Intake/Output Summary (Last 24 hours) at 08-19-19 0930 Last data filed at 08/19/2019 0845 Gross per 24 hour  Intake 2304.6 ml  Output 295 ml  Net 2009.6 ml   Filed Weights   August 19, 2019 0500  Weight: 74 kg    Examination: General:critically and chronically ill appearing, adult male HENT: Normocephalic, PERRL-sluggish.Dry mucus membranes Neck: No JVD. Trachea midline.  CV: RRR. S1S2. No MRG. Cool EXT. Radial pulses only  Lungs: BBS coarse rhonchi throughout, tachypneic, symmetrical, vent supported  ABD: no BS x4. Rounded, firm GU: Foley with scant dark urine EXT: flaccid Skin: grey, mottled EXT Neuro:+ cough/gag, does not withdraw x4     Resolved Hospital Problem list     Assessment & Plan:   Cardiac arrest Bowel ischemia Bowel perforation-non surgical candidate   Refractory Combined Septic and Cardiogenic shock Acute hypercarbic respiratory failure Shock liver AKI-anuric  DM Atrial fibrillation with RVR Hypoglycemic   The patient is in MSOF d/t perforated bowel non-amenable to surgery s/p cardiac arrest with ROSC now in refractory shock. I update the pt's NOK, Cody Mcdowell, by phone, of these findings in laymans terms and reviewed discussions she had overnight with team. Cody Mcdowell states that in the interim  she was able to locate pt's advanced directives in his home and states that under these circumstances his wishes are for comfort measures and would not want to continue on life support and she feels this is best. Due to the pandemic and her advanced age she does not wish to come to hospital to be with patient. She has contacted her pastor and friends/family for support. I assured her we will be with "Cody Mcdowell" and let her know when he passes after transitioning to comfort  focus.   DNR/Comfort transition orders placed in EMR.   Best practice:D/c    Labs   CBC: Recent Labs  Lab 07/08/2019 2046 07/15/2019 2115 August 09, 2019 0045 08/09/19 0236 09-Aug-2019 0247 09-Aug-2019 0732  WBC 16.3*  --   --  10.1  --  11.0*  NEUTROABS 11.8*  --   --   --   --   --   HGB 13.7 9.5* 10.9* 11.4* 10.2* 10.7*  HCT 44.8 28.0* 32.0* 36.4* 30.0* 33.5*  MCV 98.9  --   --  96.8  --  95.4  PLT 207  --   --  185  --  [id number]    Basic Metabolic Panel: Recent Labs  Lab 07/09/2019 2022 07/17/2019 2046 07/12/2019 2115 09-Aug-2019 0045 08/09/2019 0236 08-09-2019 0247 08/09/2019 0732  NA 133* 137 135 135 137 135 140  K 4.0 4.0 4.2 4.1 4.1 3.8 4.2  CL 102 95*  --   --  102  --  103  CO2  --  10*  --   --  8*  --  12*  GLUCOSE 180* 192*  --   --  89  --  70  BUN 50* 45*  --   --  42*  --  42*  CREATININE 2.90* 3.13*  --   --  2.70*  --  3.21*  CALCIUM  --  9.2  --   --  6.9*  --  6.7*  MG  --   --   --   --  5.6*  --  5.2*  PHOS  --   --   --   --  8.6*  --  7.4*   GFR: Estimated Creatinine Clearance: 22.7 mL/min (A) (by C-G formula based on SCr of 3.21 mg/dL (H)). Recent Labs  Lab 07/16/2019 2045 07/14/2019 2046 06/28/2019 2338 08/09/2019 0236 August 09, 2019 0732  WBC  --  16.3*  --  10.1 11.0*  LATICACIDVEN >11.0*  --  >11.0* >11.0*  --     Liver Function Tests: Recent Labs  Lab 07/03/2019 2046 2019-08-09 0732  AST 181* PENDING  ALT 152* 2,040*  ALKPHOS 94 101  BILITOT 1.2 2.0*  PROT 6.3* 4.8*  ALBUMIN 2.9* 2.6*   Recent Labs  Lab 07/23/2019 2046  LIPASE 13   No results for input(s): AMMONIA in the last 168 hours.  ABG    Component Value Date/Time   PHART 7.097 (LL) 09-Aug-2019 0247   PCO2ART 25.8 (L) 2019-08-09 0247   PO2ART 101.0 08-09-19 0247   HCO3 8.5 (L) 08-09-19 0247   TCO2 9 (L) 08/09/2019 0247   ACIDBASEDEF 21.0 (H) 08/09/19 0247   O2SAT 97.0 08/09/2019 0247     Coagulation Profile: Recent Labs  Lab 07/06/2019 2052  INR 1.5*    Cardiac Enzymes: No results for  input(s): CKTOTAL, CKMB, CKMBINDEX, TROPONINI in the last 168 hours.  HbA1C: Hgb A1c MFr Bld  Date/Time Value Ref Range Status  08/09/2019 02:36 AM 6.6 (H) 4.8 - 5.6 %  Final    Comment:    (NOTE) Pre diabetes:          5.7%-6.4% Diabetes:              >6.4% Glycemic control for   <7.0% adults with diabetes     CBG: Recent Labs  Lab 07-31-2019 0237 07-31-2019 0323 07/31/19 0401 31-Jul-2019 0814 Jul 31, 2019 0844  GLUCAP 70 51* 147* 38* 139*      The patient is critically ill with MSOF. He requires ICU for high complexity decision making, titration of high alert medications, ventilator management, titration of oxygen and interpretation of advanced monitoring.    I personally spent 35 minutes providing critical care services including personally reviewing test results, discussing care with nursing staff/other physicians and completing orders pertaining to this patient.  Time was exclusive to the patient and does not include time spent teaching or in procedures.  Voice recognition software was used in the production of this record.  Errors in interpretation may have been inadvertently missed during review.  Francine Graven, MSN, AGACNP   Pulmonary & Critical Care

## 2019-07-29 NOTE — Progress Notes (Signed)
Patient transported from ED RESUS to CT and back with no complications.

## 2019-07-29 NOTE — Progress Notes (Signed)
Referral made to [facility]. Referral [phone]. This RN spoke with Ronney Asters with [facility]. A CDS coordinator will call back with a follow up.   [address] [place]

## 2019-07-29 NOTE — Progress Notes (Signed)
eLink Physician-Brief Progress Note Patient Name: Cody Mcdowell DOB: 1950/08/03 MRN: [id number]   Date of Service  August 22, 2019  HPI/Events of Note  Pt admitted through the ED following 2 days of abdominal pain, nausea and vomiting, PEA arrest at home with 7 minutes of ACLS protocol before ROSC, intubated in the ED, CTAP shows extensive bowel ischemia and perforation, additionally he has clinical findings consistent with a hemispheric stroke, general surgery has declined surgical intervention and his family made him a DNR with no escalation of Rx. Pt's prognosis is abysmal.  eICU Interventions  Nwe patient evaluation completed.        Kerry Kass Ogan 08/22/2019, 2:19 AM

## 2019-07-29 DEATH — deceased

## 2019-07-30 LAB — CULTURE, BLOOD (ROUTINE X 2)
Culture: NO GROWTH
Culture: NO GROWTH
Special Requests: ADEQUATE

## 2020-11-20 IMAGING — CT CT CHEST W/O CM
2 of 6 series · 11 of 36 positions shown, 13 images · non-contrast
Comparison: Chest radiograph earlier this day. No remote exams
available.

CLINICAL DATA: Shortness of breath. Found unresponsive in the
bathroom. Post CPR. History of pancreatic cancer.

Abdominal distension; Shortness of breath
EXAM:
CT CHEST, ABDOMEN AND PELVIS WITHOUT CONTRAST
TECHNIQUE: Multidetector CT imaging of the chest, abdomen and pelvis was
performed following the standard protocol without IV contrast.

[Series 7: cap w/o 2.0 mm st · axial · non-contrast · 0.87mm/px · z∈[-882,-350]mm · 8 of 342 slices shown, 10 images]
[im 38/342  mediastinal]
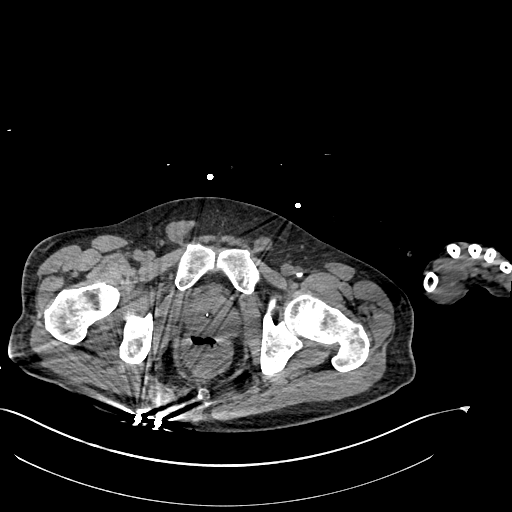
[im 38/342  lung]
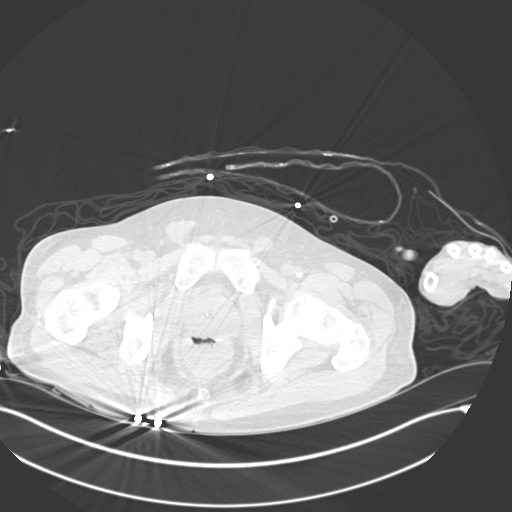
[im 76/342  lung]
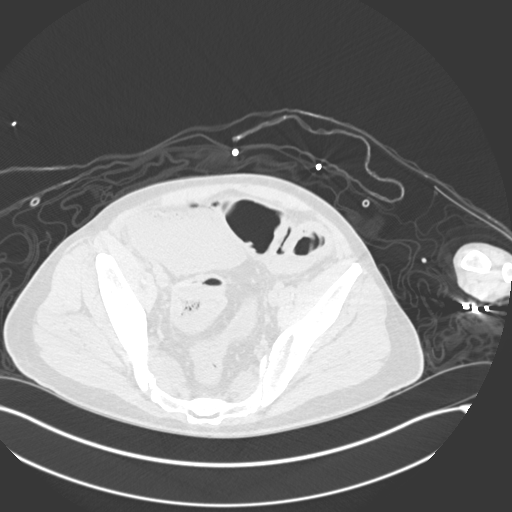
[im 114/342  lung]
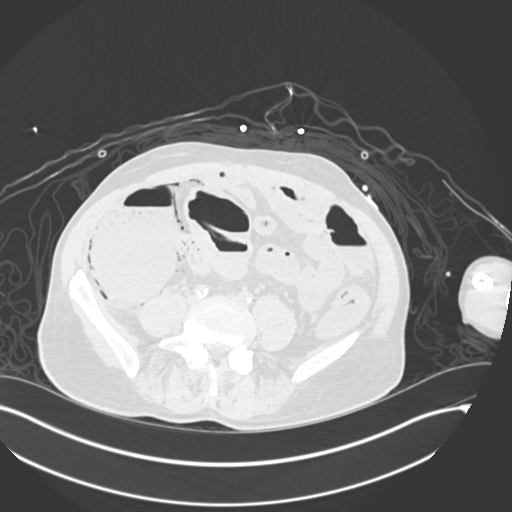
[im 152/342  lung]
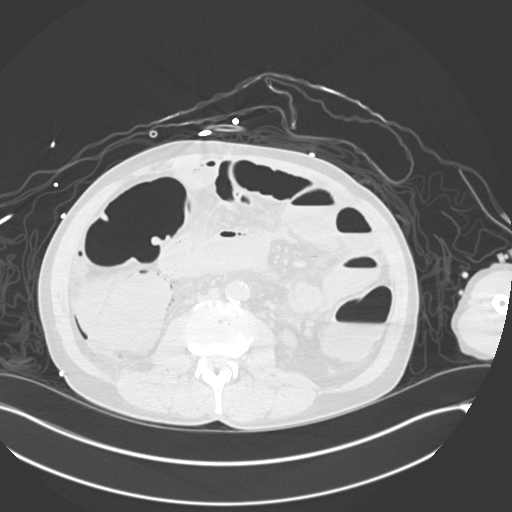
[im 190/342  mediastinal]
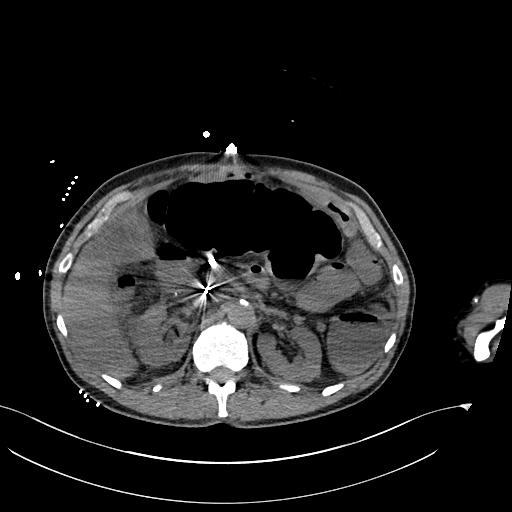
[im 190/342  lung]
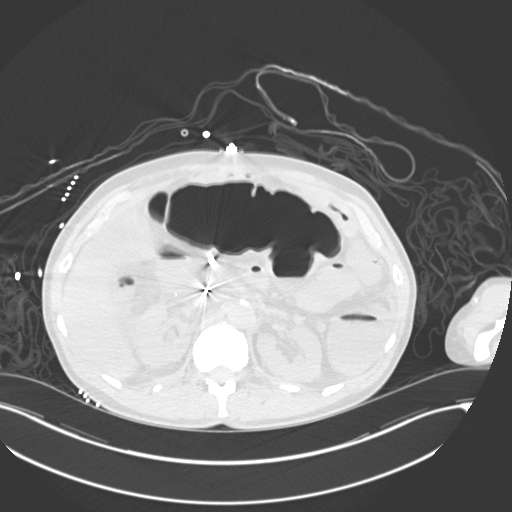
[im 228/342  lung]
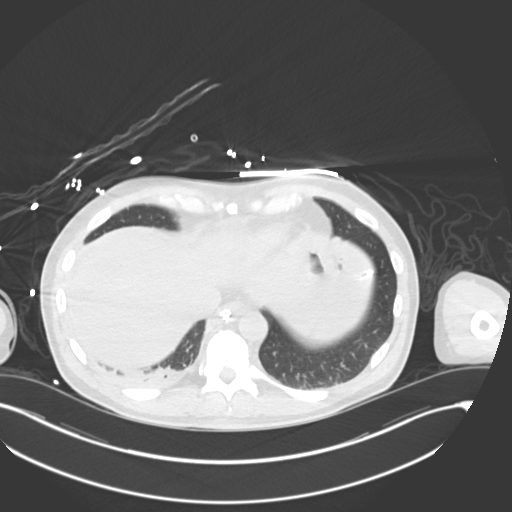
[im 266/342  lung]
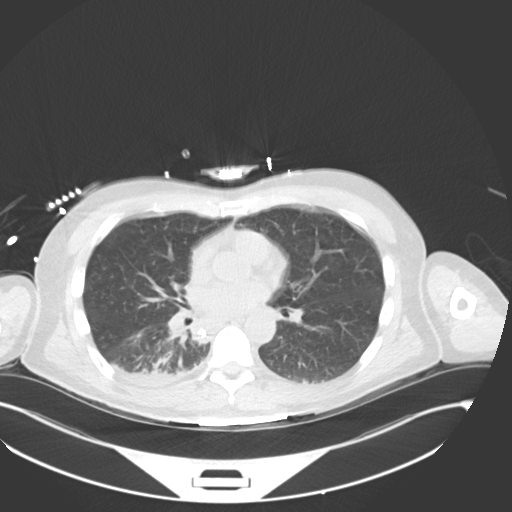
[im 304/342  lung]
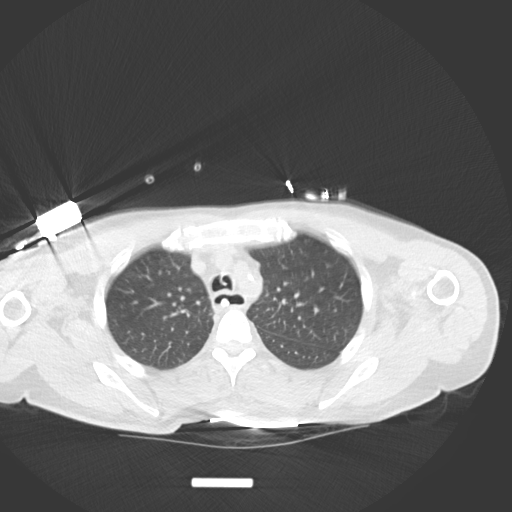

[Series 9: cap w/o 3.0 mm st cor · coronal · non-contrast · 0.68mm/px · 3 of 88 slices shown]
[im 18/88  lung]
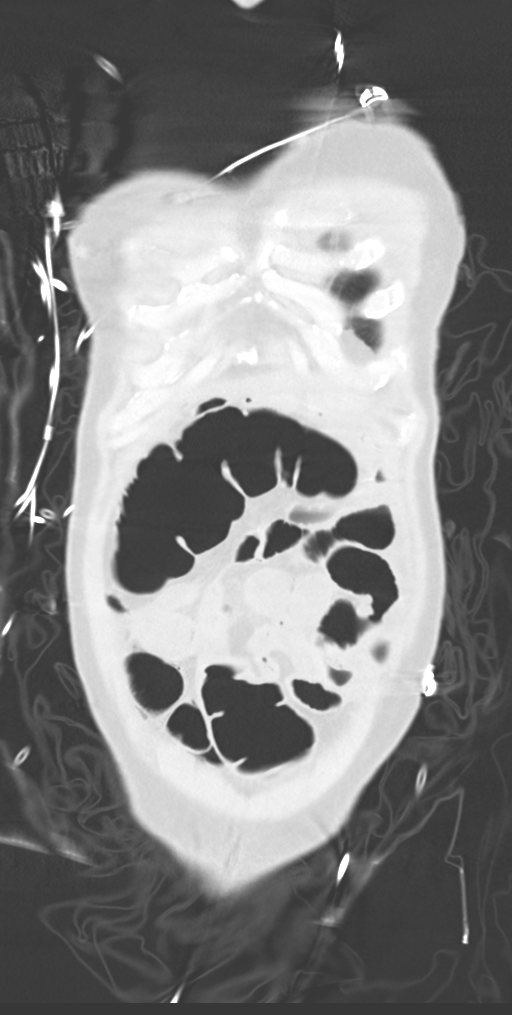
[im 35/88  lung]
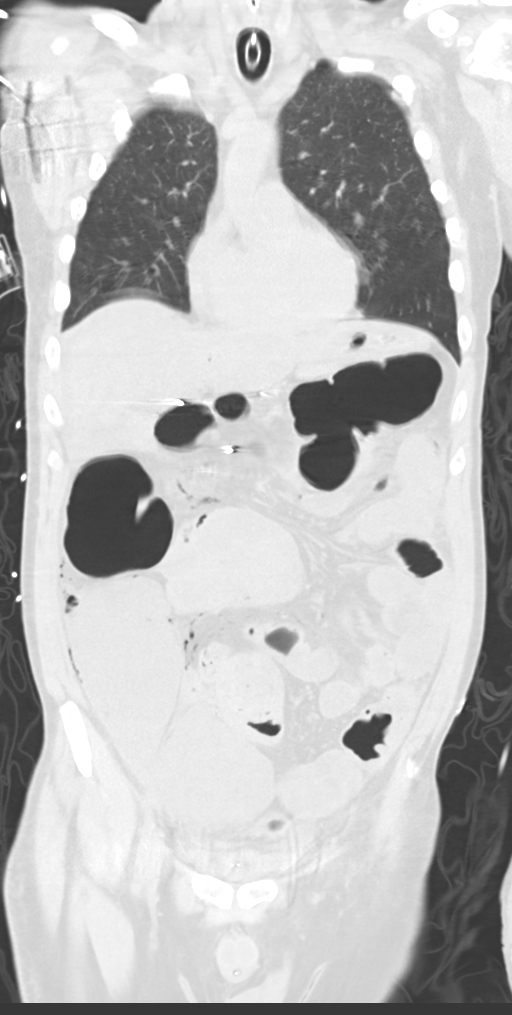
[im 53/88  lung]
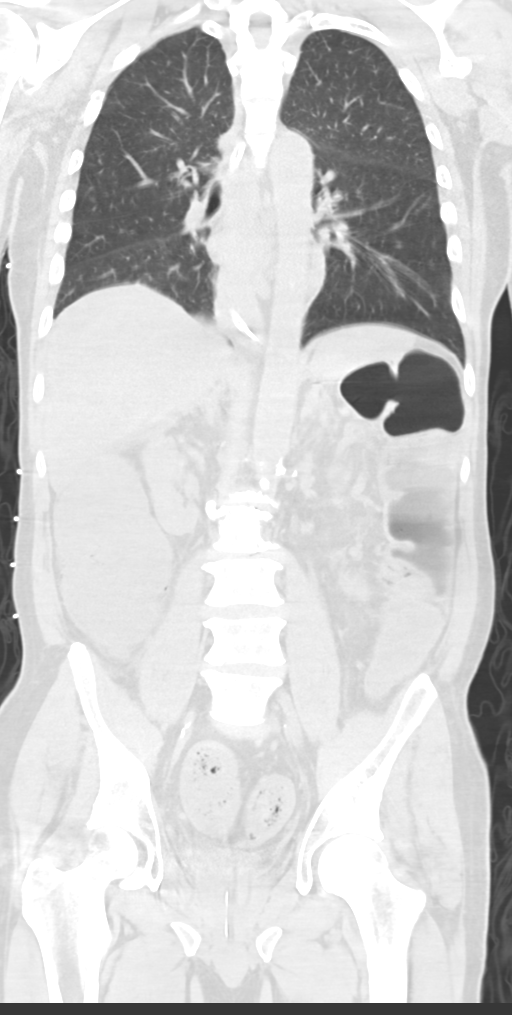

[11 of 36 positions shown; findings below may reference images not displayed]

FINDINGS: CT CHEST FINDINGS

Cardiovascular: Aortic atherosclerosis. No aortic aneurysm. Heart is
normal in size. No pericardial effusion.

Mediastinum/Nodes: 1 endotracheal tube tip above the carina. Enteric
tube in place, despite anterior to the esophagus is dilated and
fluid-filled. Limited assessment for adenopathy given lack of IV
contrast, no bulky mediastinal or hilar adenopathy. Slightly
heterogeneous thyroid gland without dominant nodule.

Lungs/Pleura: Consolidation in the dependent right lower lobe.
Minimal dependent atelectasis in the left lower lobe. Minimal debris
in the mainstem bronchi. Mild central bronchial thickening. Small
ground-glass opacity in the anterior right middle lobe may be
pulmonary contusion. No pleural fluid. No definite pulmonary nodule
or mass, breathing motion artifact limits assessment.

Musculoskeletal: Advanced degenerative change of the shoulders.
Motion artifact through the sternum limits assessment for fracture.
Motion artifact through the anterior ribs limits assessment for
fracture. No evidence of focal bone lesion.

CT ABDOMEN PELVIS FINDINGS

Hepatobiliary: Branching air in the left greater than right lobe of
the liver, favor mesenteric gas or pneumobilia. Post
cholecystectomy. Limited assessment for focal liver lesion given
motion and lack contrast.

Pancreas: No definite pancreatic tissue visualized, multiple
surgical clips suggesting prior Whipple procedure.

Spleen: No definite splenic tissue visualized, suspected
splenectomy.

Adrenals/Urinary Tract: No dominant adrenal nodule. No
hydronephrosis. There is bilateral perinephric edema. Foley catheter
decompresses the urinary bladder.

Stomach/Bowel: Enteric tube tip in the stomach. Gastric anatomy is
not well-defined. There is pneumatosis involving the cecum and
ascending colon with mottled air in the adjacent mesentery and free
air in the anterior right abdomen. Small and large bowel diffusely
fluid-filled and dilated. Mesenteric venous air is most prominent in
the ileocolic mesentery, but also seen centrally.

Vascular/Lymphatic: Aortic atherosclerosis. Air in the mesenteric
veins of the ileocolic mesentery. Limited assessment for adenopathy.

Reproductive: Prostate gland grossly normal, partially obscured by
external artifact.

Other: Mesenteric edema, air, and small amount of free fluid.
Minimal ascites/free fluid in the upper abdomen.

Musculoskeletal: No focal bone lesion.
IMPRESSION: 1. Cecal and ascending colonic pneumatosis with mottled air in the
adjacent mesentery, mesenteric venous gas and multiple foci free air
in the right abdomen. Findings consistent with bowel ischemia and
probable bowel perforation given the free air.
2. Branching air in the left greater than right lobe of the liver,
favor mesenteric gas over pneumobilia.
3. Small and large bowel diffusely fluid-filled and dilated, likely
secondary to ileus.
4. Consolidation in the dependent right lower lobe, may be
atelectasis, aspiration or pneumonia. Minimal debris in the mainstem
bronchi.
5. Probable prior Whipple procedure, recommend correlation with
surgical history.
6. Bilateral perinephric edema, nonspecific.

Aortic Atherosclerosis ([id number]).

Critical Value/emergent results were called by telephone at the time
of interpretation on 07/25/2019 at [DATE] to Klpigbb Moolman
, who verbally acknowledged these results.

## 2023-05-06 ENCOUNTER — Telehealth: Payer: Self-pay | Admitting: Medical Oncology

## 2023-05-07 NOTE — Telephone Encounter (Signed)
error
